# Patient Record
Sex: Female | Born: 1975 | Race: Black or African American | Hispanic: No | Marital: Single | Smoking: Former smoker
Health system: Southern US, Community
[De-identification: ages and names within clinical notes are randomized; demographics above are authoritative.]

## PROBLEM LIST (undated history)

## (undated) ENCOUNTER — Inpatient Hospital Stay (HOSPITAL_COMMUNITY): Payer: Self-pay

## (undated) DIAGNOSIS — I10 Essential (primary) hypertension: Secondary | ICD-10-CM

## (undated) DIAGNOSIS — D649 Anemia, unspecified: Secondary | ICD-10-CM

## (undated) DIAGNOSIS — O021 Missed abortion: Secondary | ICD-10-CM

## (undated) HISTORY — PX: CHOLECYSTECTOMY: SHX55

## (undated) HISTORY — PX: WISDOM TOOTH EXTRACTION: SHX21

---

## 1999-09-14 ENCOUNTER — Emergency Department (HOSPITAL_COMMUNITY): Admission: EM | Admit: 1999-09-14 | Discharge: 1999-09-15 | Payer: Self-pay | Admitting: Emergency Medicine

## 1999-09-15 ENCOUNTER — Encounter: Payer: Self-pay | Admitting: Emergency Medicine

## 2001-12-19 ENCOUNTER — Emergency Department (HOSPITAL_COMMUNITY): Admission: EM | Admit: 2001-12-19 | Discharge: 2001-12-19 | Payer: Self-pay

## 2002-03-12 ENCOUNTER — Other Ambulatory Visit: Admission: RE | Admit: 2002-03-12 | Discharge: 2002-03-12 | Payer: Self-pay | Admitting: *Deleted

## 2002-06-12 ENCOUNTER — Other Ambulatory Visit: Admission: RE | Admit: 2002-06-12 | Discharge: 2002-06-12 | Payer: Self-pay | Admitting: Obstetrics and Gynecology

## 2002-11-04 ENCOUNTER — Other Ambulatory Visit: Admission: RE | Admit: 2002-11-04 | Discharge: 2002-11-04 | Payer: Self-pay | Admitting: *Deleted

## 2002-12-10 ENCOUNTER — Encounter: Payer: Self-pay | Admitting: *Deleted

## 2002-12-10 ENCOUNTER — Encounter: Payer: Self-pay | Admitting: General Surgery

## 2002-12-10 ENCOUNTER — Observation Stay (HOSPITAL_COMMUNITY): Admission: EM | Admit: 2002-12-10 | Discharge: 2002-12-11 | Payer: Self-pay | Admitting: Emergency Medicine

## 2002-12-11 ENCOUNTER — Encounter (INDEPENDENT_AMBULATORY_CARE_PROVIDER_SITE_OTHER): Payer: Self-pay | Admitting: *Deleted

## 2003-03-18 ENCOUNTER — Other Ambulatory Visit: Admission: RE | Admit: 2003-03-18 | Discharge: 2003-03-18 | Payer: Self-pay | Admitting: Obstetrics and Gynecology

## 2003-09-22 ENCOUNTER — Other Ambulatory Visit: Admission: RE | Admit: 2003-09-22 | Discharge: 2003-09-22 | Payer: Self-pay | Admitting: Obstetrics and Gynecology

## 2003-11-22 HISTORY — PX: CHOLECYSTECTOMY: SHX55

## 2004-04-02 ENCOUNTER — Other Ambulatory Visit: Admission: RE | Admit: 2004-04-02 | Discharge: 2004-04-02 | Payer: Self-pay | Admitting: Obstetrics and Gynecology

## 2004-06-07 ENCOUNTER — Other Ambulatory Visit: Admission: RE | Admit: 2004-06-07 | Discharge: 2004-06-07 | Payer: Self-pay | Admitting: Obstetrics and Gynecology

## 2005-04-11 ENCOUNTER — Other Ambulatory Visit: Admission: RE | Admit: 2005-04-11 | Discharge: 2005-04-11 | Payer: Self-pay | Admitting: Obstetrics and Gynecology

## 2005-11-21 HISTORY — PX: TONSILLECTOMY: SUR1361

## 2005-12-20 ENCOUNTER — Other Ambulatory Visit: Admission: RE | Admit: 2005-12-20 | Discharge: 2005-12-20 | Payer: Self-pay | Admitting: Obstetrics & Gynecology

## 2006-05-01 ENCOUNTER — Other Ambulatory Visit: Admission: RE | Admit: 2006-05-01 | Discharge: 2006-05-01 | Payer: Self-pay | Admitting: Obstetrics & Gynecology

## 2007-05-07 ENCOUNTER — Other Ambulatory Visit: Admission: RE | Admit: 2007-05-07 | Discharge: 2007-05-07 | Payer: Self-pay | Admitting: Obstetrics and Gynecology

## 2007-11-23 ENCOUNTER — Other Ambulatory Visit: Admission: RE | Admit: 2007-11-23 | Discharge: 2007-11-23 | Payer: Self-pay | Admitting: Obstetrics and Gynecology

## 2008-05-09 ENCOUNTER — Other Ambulatory Visit: Admission: RE | Admit: 2008-05-09 | Discharge: 2008-05-09 | Payer: Self-pay | Admitting: Obstetrics and Gynecology

## 2008-11-05 ENCOUNTER — Other Ambulatory Visit: Admission: RE | Admit: 2008-11-05 | Discharge: 2008-11-05 | Payer: Self-pay | Admitting: Obstetrics & Gynecology

## 2011-04-08 NOTE — Op Note (Signed)
Mackenzie Moreno, Mackenzie Moreno                          ACCOUNT NO.:  [id number]   MEDICAL RECORD NO.:  [id number]                   PATIENT TYPE:  INP   LOCATION:  5733                                 FACILITY:  [facility]   PHYSICIAN:  Sharlet Salina T. Hoxworth, M.D.          DATE OF BIRTH:  February 03, 1976   DATE OF PROCEDURE:  12/10/2002  DATE OF DISCHARGE:                                 OPERATIVE REPORT   PREOPERATIVE DIAGNOSIS:  Cholecystitis.   POSTOPERATIVE DIAGNOSIS:  Cholecystitis.   OPERATION PERFORMED:  Laparoscopic cholecystectomy with intraoperative  cholangiogram.   SURGEON:  Sharlet Salina T. Hoxworth, M.D.   ANESTHESIA:  General.   ASSISTANT:  Thornton Park. Daphine Deutscher, M.D.   INDICATIONS FOR PROCEDURE:  The patient is a 35 year old black female who  presents with acute onset of epigastric pain radiating to her back  associated with nausea.  She presented to the [facility] Emergency  Department and gallbladder ultrasound was obtained.  This shows very  significant thickening of the gallbladder wall but no definite stones.  She  has some elevation of her transaminases and the rest of her liver function  tests are normal.  Tenderness in the epigastrium was noted on exam and she  has elevated white count.  A laparoscopic cholecystectomy with cholangiogram  for apparent acute cholecystitis has been recommended and accepted.  The  nature of the procedure, its indications and risks of bleeding, infection,  bile leak and bile duct injury were discussed and understand preoperatively.  She is now brought to the operating room for this procedure.   DESCRIPTION OF PROCEDURE:  The patient was brought to the operating room and  placed in supine position on the operating table and general endotracheal  anesthesia was induced.  She had received preoperative antibiotics.  PAS  were in place.  The abdomen was sterilely prepped and draped.  Local  anesthesia was used to infiltrate the trocar sites prior to the  incision.  A  1 cm incision was made at the umbilicus and dissection carried down to the  midline fascia which was sharply incised for 1 cm and the peritoneum entered  under direct vision.  Through a mattress suture of 0 Vicryl, the Hasson  trocar was placed and pneumoperitoneum established.  Under direct vision, a  10 mm trocar was placed in the subxiphoid area and two 5 mm trocars along  the right subcostal margin.  The gallbladder was exposed and appeared quite  edematous but without exudate or gangrene.  The fundus was grasped and  elevated up over the liver and infundibulum retracted inferolaterally.  Fibrofatty tissue was stripped off the neck of the gallbladder toward the  porta hepatis.  The cystic artery was seen coursing up anterior on the  gallbladder wall and was dissected free, doubly clipped proximally, clipped  distally and divided.  The distal gallbladder was thoroughly dissected and  the cystic duct exposed and the cystic duct,  gallbladder junction dissected  360 degrees.  When the anatomy was clear the cystic duct was clipped at the  gallbladder junction and operative cholangiogram was obtained through the  cystic duct.  This showed good filling of normal-sized common bile duct and  intrahepatic ducts with free flow into the duodenum and no filling defects.  Following this, the Cholangiocath was removed and the cystic duct was doubly  clipped proximally and divided.  The gallbladder was then dissected free  from its bed using hook cautery and removed through the umbilicus.  Complete  hemostasis was assured in the operative site and right upper quadrant  irrigated and suctioned until clear.  Trocar was removed under direct  vision.  All CO2 was evacuated from the peritoneal cavity.  The mattress  suture was secured at the umbilicus.  The skin incisions were closed with  interrupted subcuticular 4-0 Monocryl and Steri-Strips.  Sponge, needle and  instrument counts were  correct.  Dry sterile dressings were applied.  The  patient was taken to the recovery room in good condition.                                                Lorne Skeens. Hoxworth, M.D.    Tory Emerald  D:  12/10/2002  T:  12/11/2002  Job:  [id number]

## 2012-05-13 ENCOUNTER — Encounter (HOSPITAL_COMMUNITY): Payer: Self-pay | Admitting: Pharmacist

## 2012-05-14 ENCOUNTER — Encounter (HOSPITAL_COMMUNITY): Payer: Self-pay | Admitting: Pharmacy Technician

## 2012-05-14 ENCOUNTER — Encounter (HOSPITAL_COMMUNITY): Payer: Self-pay | Admitting: *Deleted

## 2012-05-15 ENCOUNTER — Encounter (HOSPITAL_COMMUNITY): Payer: Self-pay

## 2012-05-15 ENCOUNTER — Encounter (HOSPITAL_COMMUNITY)
Admission: RE | Admit: 2012-05-15 | Discharge: 2012-05-15 | Disposition: A | Discharge status: Home / Self Care | Payer: BC Managed Care – PPO | Source: Ambulatory Visit | Attending: Obstetrics & Gynecology | Admitting: Obstetrics & Gynecology

## 2012-05-15 LAB — ABO/RH: ABO/RH(D): O POS

## 2012-05-15 LAB — CBC
HCT: 35.7 % — ABNORMAL LOW (ref 36.0–46.0)
Hemoglobin: 11.9 g/dL — ABNORMAL LOW (ref 12.0–15.0)
MCHC: 33.3 g/dL (ref 30.0–36.0)
RBC: 3.83 MIL/uL — ABNORMAL LOW (ref 3.87–5.11)

## 2012-05-15 NOTE — Patient Instructions (Addendum)
20 Mackenzie Moreno  05/15/2012   Your procedure is scheduled on:  06/17/12  Enter through the Main Entrance of [facility] at 6 AM.  Pick up the phone at the desk and dial [phone].   Call this number if you have problems the morning of surgery: [phone]   Remember:   Do not eat food:After Midnight.  Do not drink clear liquids: After Midnight.  Take these medicines the morning of surgery with A SIP OF WATER: Blood pressure medication   Do not wear jewelry, make-up or nail polish.  Do not wear lotions, powders, or perfumes. You may wear deodorant.  Do not shave 48 hours prior to surgery.  Do not bring valuables to the hospital.  Contacts, dentures or bridgework may not be worn into surgery.  Leave suitcase in the car. After surgery it may be brought to your room.  For patients admitted to the hospital, checkout time is 11:00 AM the day of discharge.   Patients discharged the day of surgery will not be allowed to drive home.  Name and phone number of your driver: Kandra Nicolas   [phone]  Special Instructions: CHG Shower Use Special Wash: 1/2 bottle night before surgery and 1/2 bottle morning of surgery.   Please read over the following fact sheets that you were given: Surgical Site Infection Prevention

## 2012-05-17 MED ORDER — CEFAZOLIN SODIUM-DEXTROSE 2-3 GM-% IV SOLR
2.0000 g | INTRAVENOUS | Status: AC
  Administered 2012-05-18: 2 g via INTRAVENOUS
  Filled 2012-05-17: qty 50

## 2012-05-18 ENCOUNTER — Ambulatory Visit (HOSPITAL_COMMUNITY)
Admission: RE | Admit: 2012-05-18 | Discharge: 2012-05-18 | Disposition: A | Discharge status: Home / Self Care | Payer: BC Managed Care – PPO | Source: Ambulatory Visit | Attending: Obstetrics & Gynecology | Admitting: Obstetrics & Gynecology

## 2012-05-18 ENCOUNTER — Encounter (HOSPITAL_COMMUNITY)
Admission: RE | Disposition: A | Discharge status: Home / Self Care | Payer: Self-pay | Source: Ambulatory Visit | Attending: Obstetrics & Gynecology

## 2012-05-18 ENCOUNTER — Encounter (HOSPITAL_COMMUNITY): Payer: Self-pay | Admitting: Anesthesiology

## 2012-05-18 ENCOUNTER — Ambulatory Visit (HOSPITAL_COMMUNITY): Payer: BC Managed Care – PPO | Admitting: Anesthesiology

## 2012-05-18 DIAGNOSIS — Z01812 Encounter for preprocedural laboratory examination: Secondary | ICD-10-CM | POA: Insufficient documentation

## 2012-05-18 DIAGNOSIS — O021 Missed abortion: Secondary | ICD-10-CM | POA: Insufficient documentation

## 2012-05-18 HISTORY — PX: DILATION AND EVACUATION: SHX1459

## 2012-05-18 HISTORY — DX: Anemia, unspecified: D64.9

## 2012-05-18 HISTORY — DX: Essential (primary) hypertension: I10

## 2012-05-18 HISTORY — DX: Missed abortion: O02.1

## 2012-05-18 SURGERY — DILATION AND EVACUATION, UTERUS
Anesthesia: Monitor Anesthesia Care | Site: Uterus | Wound class: Clean Contaminated

## 2012-05-18 MED ORDER — ONDANSETRON HCL 4 MG/2ML IJ SOLN
INTRAMUSCULAR | Status: AC
  Filled 2012-05-18: qty 2

## 2012-05-18 MED ORDER — FENTANYL CITRATE 0.05 MG/ML IJ SOLN
INTRAMUSCULAR | Status: AC
  Administered 2012-05-18: 50 ug via INTRAVENOUS
  Filled 2012-05-18: qty 2

## 2012-05-18 MED ORDER — PROPOFOL 10 MG/ML IV EMUL
INTRAVENOUS | Status: DC | PRN
  Administered 2012-05-18: 180 mg via INTRAVENOUS

## 2012-05-18 MED ORDER — MIDAZOLAM HCL 2 MG/2ML IJ SOLN
INTRAMUSCULAR | Status: AC
  Filled 2012-05-18: qty 2

## 2012-05-18 MED ORDER — LIDOCAINE HCL (CARDIAC) 20 MG/ML IV SOLN
INTRAVENOUS | Status: AC
  Filled 2012-05-18: qty 5

## 2012-05-18 MED ORDER — ONDANSETRON HCL 4 MG/2ML IJ SOLN: 4.0000 mg | Freq: Once | INTRAMUSCULAR | Status: DC | PRN

## 2012-05-18 MED ORDER — OXYCODONE-ACETAMINOPHEN 5-325 MG PO TABS: 1.0000 | ORAL_TABLET | ORAL | Status: AC | PRN

## 2012-05-18 MED ORDER — LIDOCAINE-EPINEPHRINE 1 %-1:100000 IJ SOLN
INTRAMUSCULAR | Status: DC | PRN
  Administered 2012-05-18: 10 mL

## 2012-05-18 MED ORDER — DEXAMETHASONE SODIUM PHOSPHATE 10 MG/ML IJ SOLN
INTRAMUSCULAR | Status: AC
  Filled 2012-05-18: qty 1

## 2012-05-18 MED ORDER — MIDAZOLAM HCL 5 MG/5ML IJ SOLN
INTRAMUSCULAR | Status: DC | PRN
  Administered 2012-05-18: 2 mg via INTRAVENOUS

## 2012-05-18 MED ORDER — KETOROLAC TROMETHAMINE 30 MG/ML IJ SOLN
INTRAMUSCULAR | Status: DC | PRN
  Administered 2012-05-18: 30 mg via INTRAVENOUS

## 2012-05-18 MED ORDER — FENTANYL CITRATE 0.05 MG/ML IJ SOLN
25.0000 ug | INTRAMUSCULAR | Status: DC | PRN
  Administered 2012-05-18: 50 ug via INTRAVENOUS

## 2012-05-18 MED ORDER — KETOROLAC TROMETHAMINE 60 MG/2ML IM SOLN
INTRAMUSCULAR | Status: DC | PRN
  Administered 2012-05-18: 30 mg via INTRAMUSCULAR

## 2012-05-18 MED ORDER — LIDOCAINE HCL (CARDIAC) 20 MG/ML IV SOLN
INTRAVENOUS | Status: DC | PRN
  Administered 2012-05-18: 60 mg via INTRAVENOUS

## 2012-05-18 MED ORDER — PROPOFOL 10 MG/ML IV EMUL
INTRAVENOUS | Status: AC
  Filled 2012-05-18: qty 20

## 2012-05-18 MED ORDER — KETOROLAC TROMETHAMINE 30 MG/ML IJ SOLN: 15.0000 mg | Freq: Once | INTRAMUSCULAR | Status: DC | PRN

## 2012-05-18 MED ORDER — ONDANSETRON HCL 4 MG/2ML IJ SOLN
INTRAMUSCULAR | Status: DC | PRN
  Administered 2012-05-18: 4 mg via INTRAVENOUS

## 2012-05-18 MED ORDER — FENTANYL CITRATE 0.05 MG/ML IJ SOLN
INTRAMUSCULAR | Status: AC
  Filled 2012-05-18: qty 2

## 2012-05-18 MED ORDER — FENTANYL CITRATE 0.05 MG/ML IJ SOLN
INTRAMUSCULAR | Status: DC | PRN
  Administered 2012-05-18: 100 ug via INTRAVENOUS

## 2012-05-18 MED ORDER — LACTATED RINGERS IV SOLN
INTRAVENOUS | Status: DC
  Administered 2012-05-18 (×2): via INTRAVENOUS

## 2012-05-18 MED ORDER — MEPERIDINE HCL 25 MG/ML IJ SOLN: 6.2500 mg | INTRAMUSCULAR | Status: DC | PRN

## 2012-05-18 MED ORDER — DEXAMETHASONE SODIUM PHOSPHATE 10 MG/ML IJ SOLN
INTRAMUSCULAR | Status: DC | PRN
  Administered 2012-05-18: 10 mg via INTRAVENOUS

## 2012-05-18 SURGICAL SUPPLY — 16 items
CATH ROBINSON RED A/P 16FR (CATHETERS) ×2 IMPLANT
CLOTH BEACON ORANGE TIMEOUT ST (SAFETY) ×2 IMPLANT
DECANTER SPIKE VIAL GLASS SM (MISCELLANEOUS) ×2 IMPLANT
GLOVE BIOGEL PI IND STRL 7.0 (GLOVE) ×1 IMPLANT
GLOVE BIOGEL PI INDICATOR 7.0 (GLOVE) ×1
GLOVE ECLIPSE 6.5 STRL STRAW (GLOVE) ×4 IMPLANT
GOWN STRL REIN XL XLG (GOWN DISPOSABLE) ×4 IMPLANT
KIT BERKELEY 1ST TRIMESTER 3/8 (MISCELLANEOUS) ×2 IMPLANT
NEEDLE SPNL 22GX3.5 QUINCKE BK (NEEDLE) ×2 IMPLANT
NS IRRIG 1000ML POUR BTL (IV SOLUTION) ×2 IMPLANT
PACK VAGINAL MINOR WOMEN LF (CUSTOM PROCEDURE TRAY) ×2 IMPLANT
PAD PREP 24X48 CUFFED NSTRL (MISCELLANEOUS) ×2 IMPLANT
SET BERKELEY SUCTION TUBING (SUCTIONS) ×2 IMPLANT
SYR CONTROL 10ML LL (SYRINGE) ×2 IMPLANT
TOWEL OR 17X24 6PK STRL BLUE (TOWEL DISPOSABLE) ×4 IMPLANT
VACURETTE 8 RIGID CVD (CANNULA) ×2 IMPLANT

## 2012-05-18 NOTE — Transfer of Care (Signed)
Immediate Anesthesia Transfer of Care Note  Patient: Mackenzie Moreno  Procedure(s) Performed: Procedure(s) (LRB): DILATATION AND EVACUATION (N/A)  Patient Location: PACU  Anesthesia Type: General  Level of Consciousness: awake  Airway & Oxygen Therapy: Patient Spontanous Breathing and Patient connected to nasal cannula oxygen  Post-op Assessment: Report given to PACU RN and Post -op Vital signs reviewed and stable  Post vital signs: stable  Complications: No apparent anesthesia complications

## 2012-05-18 NOTE — Op Note (Signed)
05/18/2012  8:14 AM  PATIENT:  Mackenzie Moreno  36 y.o. female with unsure LMP but approximately 12-[redacted] weeks gestation with missed abortion  PRE-OPERATIVE DIAGNOSIS:  Missed Abortion  POST-OPERATIVE DIAGNOSIS:  Missed Abortion  PROCEDURE:  Procedure(s): DILATATION AND EVACUATION  SURGEON:  Whittany Parish SUZANNE  ASSISTANTS: OR staff   ANESTHESIA:   general  ESTIMATED BLOOD LOSS: * No blood loss amount entered *  BLOOD ADMINISTERED:none   FLUIDS: 1000 cc LR  UOP: 250 cc clear, drained with I&O cath at beginning of procedure  SPECIMEN:  Products of conception  DISPOSITION OF SPECIMEN:  PATHOLOGY  FINDINGS: uterus about 10 weeks in size, anteflexed, mobile  DESCRIPTION OF OPERATION: Patient was taken to the operating room. She placed in the supine position. Anesthesia was administered by the anesthesia staff without difficult. Legs were then lifted to the low lithotomy position in [place] stirrups. SCDs were on her lower extremities bilaterally and functioning properly. The legs were then lifted to the high lithotomy position. Timeout was performed. 2 g of Ancef were given. Patient was prepped with Betadine prep on the perineum, inner thighs, and vagina x3. She was then draped in a normal standard fashion.  A standard I&O catheterization of the bladder was performed with a red rubber foley catheter.  Then a bivalve speculum was placed in the vagina. The anterior lip of the cervix was grasped with a single-tooth tenaculum. A paracervical block 1% lidocaine mixed 1:1 with epinephrine (1:100,000 units) was used. 10 cc total was used for the paracervical block. The uterus sounded to 10 cm with the uterine sound. The uterus was dilated to #25 with Shawnie Pons dilators. Then #8 curved suction tip was obtained. This was passed through the endocervical canal to the fundus of the uterus. Suction was applied. Again a clockwise fashion the contents of the uterus were evacuated. Suction was stopped and the  tip was removed. Then using a #1 smooth curette, the endometrial cavity was curetted until rough gritty texture was noted in all quadrants. 2 more passes of the suction tip were performed, though minimal blood or tissue was obtained. At this point the procedure was ended. The suction was discontinued and the tip was removed from the uterus. There was minimal bleeding from the cervical os. The tenaculum was removed from the anterior lip of the cervix. There is no bleeding noted from the tenaculum sites. The speculum was then removed.  Sponge, laps, instruments, and needle counts were correct x2. The legs are positioned back down to the supine position after the Betadine prep was cleansed of the skin. Patient tolerated procedure well. She was awake from anesthesia and taken to recovery stable condition. An in and  COUNTS:  YES  PLAN OF CARE: Transfer to PACU

## 2012-05-18 NOTE — Anesthesia Preprocedure Evaluation (Addendum)
Anesthesia Evaluation  Patient identified by MRN, date of birth, ID band Patient awake    Reviewed: Allergy & Precautions, H&P , Patient's Chart, lab work & pertinent test results, reviewed documented beta blocker date and time   Airway Mallampati: II TM Distance: >3 FB Neck ROM: full    Dental No notable dental hx.    Pulmonary  breath sounds clear to auscultation  Pulmonary exam normal       Cardiovascular hypertension, On Medications Rhythm:regular Rate:Normal     Neuro/Psych    GI/Hepatic   Endo/Other    Renal/GU      Musculoskeletal   Abdominal   Peds  Hematology   Anesthesia Other Findings   Reproductive/Obstetrics                          Anesthesia Physical Anesthesia Plan  ASA: II  Anesthesia Plan: General and MAC   Post-op Pain Management:    Induction: Intravenous  Airway Management Planned: LMA, Mask and Simple Face Mask  Additional Equipment:   Intra-op Plan:   Post-operative Plan:   Informed Consent: I have reviewed the patients History and Physical, chart, labs and discussed the procedure including the risks, benefits and alternatives for the proposed anesthesia with the patient or authorized representative who has indicated his/her understanding and acceptance.   Dental Advisory Given  Plan Discussed with: CRNA and Surgeon  Anesthesia Plan Comments: (  Discussed MAC ; also possibly LMA( general anesthesia), including possible nausea, instrumentation of airway, sore throat,pulmonary aspiration, etc. I asked if the were any outstanding questions, or  concerns before we proceeded. )       Anesthesia Quick Evaluation

## 2012-05-18 NOTE — Anesthesia Postprocedure Evaluation (Signed)
Anesthesia Post Note  Patient: Mackenzie Moreno  Procedure(s) Performed: Procedure(s) (LRB): DILATATION AND EVACUATION (N/A)  Anesthesia type: General  Patient location: PACU  Post pain: Pain level controlled  Post assessment: Post-op Vital signs reviewed  Last Vitals:  Filed Vitals:   05/18/12 0830  BP: 134/89  Pulse: 91  Temp:   Resp: 23    Post vital signs: Reviewed  Level of consciousness: sedated  Complications: No apparent anesthesia complicationsfj

## 2012-05-18 NOTE — H&P (Signed)
Mackenzie Moreno is an 36 y.o. Mackenzie Moreno with missed abortion here for definitive surgical management.  Patient's LMP is not definite but best estimate is 02/20/12.  Because of unsure LMP, she has been followed with serial ultrasounds/HCGs in my office.  First ultrasound was performed 5/22 showing a gestational sac, yolk sac but no evidence of fetal pole.  Follow up ultrasound on 6/4 showed fetal pole but inappropriate growth of the gestational sac.  Questionable heartbeat at 70 bpm but this was very similar to her heart rate that visit.  Ultrasound again repeated on 6/19 with no interval growth of fetal pole and now clearly no heartbeat.  At this time, patient requested definitive management.  She requested to wait until today because of scheduling2 with work.  Pertinent Gynecological History: Menses: none since 4/1 Bleeding: none Contraception: none DES exposure: denies Blood transfusions: none Sexually transmitted diseases: none [facility]  Last mammogram: n/a Date: n/a Last pap: normal Date: 7/12 OB History: G2, P0   Menstrual History: Menarche age: 80-14 Patient's last menstrual period was 02/20/2012.    Past Medical History  Diagnosis Date  . Hypertension     on aldomet  . Anemia   . Missed ab current    Past Surgical History  Procedure Date  . Wisdom tooth extraction   . Cholecystectomy 2005  . Tonsillectomy 2007    History reviewed. No pertinent family history.  Social History:  reports that she has never smoked. She does not have any smokeless tobacco history on file. She reports that she drinks alcohol. She reports that she does not use illicit drugs.  Allergies:  Allergies  Allergen Reactions  . Vicodin (Hydrocodone-Acetaminophen) Rash    Prescriptions prior to admission  Medication Sig Dispense Refill  . methyldopa (ALDOMET) 250 MG tablet Take 250 mg by mouth 2 (two) times daily.      . Prenatal Vit-Fe  Fumarate-FA (PRENATAL MULTIVITAMIN) TABS Take 1 tablet by mouth every morning.        Review of Systems  Constitutional: Negative for fever and chills.  Respiratory: Negative for cough and hemoptysis.   Cardiovascular: Negative for chest pain and palpitations.  Gastrointestinal: Negative for heartburn and nausea.  Genitourinary: Negative for dysuria.  Musculoskeletal: Negative for myalgias.  Skin: Negative for rash.  Neurological: Negative for headaches.  Endo/Heme/Allergies: Does not bruise/bleed easily.  Psychiatric/Behavioral: Negative for depression.    Blood pressure 125/84, pulse 80, temperature 98.8 F (37.1 C), temperature source Oral, resp. rate 16, last menstrual period 02/20/2012, SpO2 99.00%. Physical Exam  Vitals reviewed. Constitutional: She is oriented to person, place, and time. She appears well-developed and well-nourished.  HENT:  Head: Normocephalic and atraumatic.  Neck: Normal range of motion. Neck supple.  Cardiovascular: Normal rate, regular rhythm and normal heart sounds.   Respiratory: Effort normal and breath sounds normal.  GI: Soft. Bowel sounds are normal.  Musculoskeletal: Normal range of motion.  Neurological: She is alert and oriented to person, place, and time.  Skin: Skin is warm and dry.  Psychiatric: She has a normal mood and affect.    No results found for this or any previous visit (from the past 24 hour(s)).  No results found.  Assessment/Plan: 36 year old G7A1 Moreno with missed abortion here for definitive management via suction D&E.  Risks and benefits have all been discussed in my office.  Patient here and ready to proceed.  Valentina Shaggy SUZANNE 05/18/2012, 7:07 AM

## 2012-05-18 NOTE — Discharge Instructions (Addendum)
DISCHARGE INSTRUCTIONS: D&E  **No Ibuprofen containing medications until after 2:00 pm today**  The following instructions have been prepared to help you care for yourself upon your return home.   Personal hygiene: Marland Kitchen Use sanitary pads for vaginal drainage, not tampons. . Shower the day after your procedure. . NO tub baths, pools or Jacuzzis for 2-3 weeks. . Wipe front to back after using the bathroom.  Activity and limitations: . Do NOT drive or operate any equipment for 24 hours. The effects of anesthesia are still present and drowsiness may result. . Do NOT rest in bed all day. . Walking is encouraged. . Walk up and down stairs slowly. . You may resume your normal activity in one to two days or as indicated by your physician.  Sexual activity: NO intercourse for at least 2 weeks after the procedure, or as indicated by your physician.  Diet: Eat a light meal as desired this evening. You may resume your usual diet tomorrow.  Return to work: You may resume your work activities in one to two days or as indicated by your doctor.  What to expect after your surgery: Expect to have vaginal bleeding/discharge for 2-3 days and spotting for up to 10 days. It is not unusual to have soreness for up to 1-2 weeks. You may have a slight burning sensation when you urinate for the first day. Mild cramps may continue for a couple of days. You may have a regular period in 2-6 weeks.  You should not have heavy bleeding.  If you do, you need to call the office and reach the on-call physician at night or over the weekend.  Call your doctor for any of the following: . Excessive vaginal bleeding, saturating and changing one pad every hour. . Inability to urinate 6 hours after discharge from hospital. . Pain not relieved by pain medication. . Fever of 100.4 F or greater. . Unusual vaginal discharge or odor.  Medication: Use Percocet as needed for significant cramping.  Otherwise, you can take 800mg   Motrin/ibuprofen (4, 200mg  tablets) every 8 hours as needed for cramping. Once your Aldomet is gone, you can restart your Lisinopril.  Please call the office if you need a new prescription for this.  Return to office ________________ Call for an appointment ___________________  Patient's signature: ______________________  Nurse's signature ________________________  Support person's signature________________________

## 2012-05-22 ENCOUNTER — Encounter (HOSPITAL_COMMUNITY): Payer: Self-pay | Admitting: Obstetrics & Gynecology

## 2013-09-23 ENCOUNTER — Emergency Department (HOSPITAL_COMMUNITY)
Admission: EM | Admit: 2013-09-23 | Discharge: 2013-09-23 | Disposition: A | Discharge status: Home / Self Care | Payer: No Typology Code available for payment source | Attending: Emergency Medicine | Admitting: Emergency Medicine

## 2013-09-23 ENCOUNTER — Encounter (HOSPITAL_COMMUNITY): Payer: Self-pay | Admitting: Emergency Medicine

## 2013-09-23 DIAGNOSIS — I10 Essential (primary) hypertension: Secondary | ICD-10-CM | POA: Insufficient documentation

## 2013-09-23 DIAGNOSIS — Z79899 Other long term (current) drug therapy: Secondary | ICD-10-CM | POA: Insufficient documentation

## 2013-09-23 DIAGNOSIS — Z8742 Personal history of other diseases of the female genital tract: Secondary | ICD-10-CM | POA: Insufficient documentation

## 2013-09-23 DIAGNOSIS — Z862 Personal history of diseases of the blood and blood-forming organs and certain disorders involving the immune mechanism: Secondary | ICD-10-CM | POA: Insufficient documentation

## 2013-09-23 DIAGNOSIS — R21 Rash and other nonspecific skin eruption: Secondary | ICD-10-CM | POA: Insufficient documentation

## 2013-09-23 MED ORDER — PREDNISONE 20 MG PO TABS
60.0000 mg | ORAL_TABLET | Freq: Once | ORAL | Status: AC
  Administered 2013-09-23: 60 mg via ORAL
  Filled 2013-09-23: qty 3

## 2013-09-23 MED ORDER — FAMOTIDINE 20 MG PO TABS
20.0000 mg | ORAL_TABLET | Freq: Once | ORAL | Status: AC
  Administered 2013-09-23: 20 mg via ORAL
  Filled 2013-09-23: qty 1

## 2013-09-23 MED ORDER — FAMOTIDINE 20 MG PO TABS: 20.0000 mg | ORAL_TABLET | Freq: Two times a day (BID) | ORAL | Status: DC

## 2013-09-23 MED ORDER — PREDNISONE 10 MG PO TABS: 20.0000 mg | ORAL_TABLET | Freq: Every day | ORAL | Status: DC

## 2013-09-23 NOTE — ED Provider Notes (Addendum)
CSN: [id number]     Arrival date & time 09/23/13  1745 History   First MD Initiated Contact with Patient 09/23/13 1928     Chief Complaint  Patient presents with  . Rash   (Consider location/radiation/quality/duration/timing/severity/associated sxs/prior Treatment) HPI Comments: Patient reports, that 2 weeks, ago, she began with an itchy rash on her chest that has spread to her trunk, lower back, and today.  She noticed some lesions on her forearms.  She has tried Clorox therapy 3, days ago, to her anterior chest, which just excoriated.  The skin did nothing for the rash or itch.  She has not taken any of the medication.  She denies any shortness of breath, tongue swelling, dizziness, abdominal pain.  New cosmetics or clothing, etc. Or Hx of having previous rash  Patient is a 37 y.o. female presenting with rash. The history is provided by the patient.  Rash Location:  Torso Torso rash location:  R chest, L chest, abd LUQ, abd RUQ, abd LLQ, abd RLQ and lower back Quality: burning, dryness, itchiness, painful and redness   Quality: not blistering, not bruising, not draining, not scaling, not swelling and not weeping   Pain details:    Quality:  Burning and itching   Severity:  No pain   Onset quality:  Gradual   Duration:  2 months   Timing:  Constant   Progression:  Worsening Onset quality:  Gradual Duration:  2 weeks Timing:  Constant Progression:  Worsening Chronicity:  New Context: not animal contact, not exposure to similar rash, not insect bite/sting, not medications, not new detergent/soap and not sick contacts   Relieved by:  Anti-itch cream Worsened by:  Nothing tried Ineffective treatments: clorox therapy. Associated symptoms: no abdominal pain, no fever, no headaches, no joint pain, no myalgias and no shortness of breath     Past Medical History  Diagnosis Date  . Hypertension     on aldomet  . Anemia   . Missed ab current   Past Surgical History  Procedure  Laterality Date  . Wisdom tooth extraction    . Cholecystectomy  2005  . Tonsillectomy  2007  . Dilation and evacuation  05/18/2012    Procedure: DILATATION AND EVACUATION;  Surgeon: Annamaria Boots, MD;  Location: [facility];  Service: Gynecology;  Laterality: N/A;   History reviewed. No pertinent family history. History  Substance Use Topics  . Smoking status: Never Smoker   . Smokeless tobacco: Not on file  . Alcohol Use: Yes     Comment: social   OB History   Grav Para Term Preterm Abortions TAB SAB Ect Mult Living                 Review of Systems  Constitutional: Negative for fever and chills.  HENT: Negative for trouble swallowing.   Respiratory: Negative for cough and shortness of breath.   Cardiovascular: Negative for chest pain.  Gastrointestinal: Negative for abdominal pain.  Musculoskeletal: Negative for arthralgias and myalgias.  Skin: Positive for rash. Negative for wound.  Neurological: Negative for headaches.  All other systems reviewed and are negative.    Allergies  Vicodin  Home Medications   Current Outpatient Rx  Name  Route  Sig  Dispense  Refill  . famotidine (PEPCID) 20 MG tablet   Oral   Take 1 tablet (20 mg total) by mouth 2 (two) times daily.   10 tablet   0   . methyldopa (ALDOMET) 250 MG tablet  Oral   Take 250 mg by mouth 2 (two) times daily.         . predniSONE (DELTASONE) 10 MG tablet   Oral   Take 2 tablets (20 mg total) by mouth daily.   14 tablet   0    BP 150/108  Pulse 94  Temp(Src) 99.2 F (37.3 C) (Oral)  Resp 16  Wt 234 lb 8 oz (106.369 kg)  SpO2 99% Physical Exam  Nursing note and vitals reviewed. Constitutional: She appears well-developed and well-nourished.  HENT:  Head: Normocephalic.  Eyes: Pupils are equal, round, and reactive to light.  Neck: Normal range of motion.  Cardiovascular: Normal rate.   Pulmonary/Chest: Effort normal.  Musculoskeletal: Normal range of motion.  Neurological: She is  alert.  Skin: Rash noted.    ED Course  Procedures (including critical care time) Labs Review Labs Reviewed - No data to display Imaging Review No results found.  EKG Interpretation   None       MDM   1. Rash and nonspecific skin eruption     Patient has a red, slightly raised rash with excoriations to the anterior chest, where she is apply Clorox and scratch the area.  There is new sign of superimposed infection.  We'll treat with Pepcid and steroids    Arman Filter, NP 09/23/13 1943  Arman Filter, NP 10/02/13 [phone]

## 2013-09-23 NOTE — ED Notes (Signed)
Pt with red rash to chest last week, started to spread to abdomen and back this week. Initially was not itchy or painful but she did try otc creams and also applied small amount of clorox to the rash which "only irritated it and made it hurt.'

## 2013-09-23 NOTE — Discharge Instructions (Signed)
Rashes are generally nonspecific as his yours You have been treated with an antihistamine as well as a steroid to break the histamine cycle.  Please take medications as directed.  Return if your rash is worsening.  We develop new concerning symptoms

## 2013-09-23 NOTE — ED Provider Notes (Signed)
Medical screening examination/treatment/procedure(s) were performed by non-physician practitioner and as supervising physician I was immediately available for consultation/collaboration.  Ardyn Forge L Saida Lonon, MD 09/23/13 2209 

## 2013-10-07 NOTE — ED Provider Notes (Signed)
Medical screening examination/treatment/procedure(s) were performed by non-physician practitioner and as supervising physician I was immediately available for consultation/collaboration.  EKG Interpretation   None        Flint Melter, MD 10/07/13 1112

## 2013-11-04 ENCOUNTER — Ambulatory Visit: Payer: Self-pay | Admitting: Nurse Practitioner

## 2014-01-18 ENCOUNTER — Inpatient Hospital Stay (HOSPITAL_COMMUNITY)
Admission: AD | Admit: 2014-01-18 | Discharge: 2014-01-18 | Disposition: A | Discharge status: Home / Self Care | Payer: BC Managed Care – PPO | Source: Ambulatory Visit | Attending: Obstetrics and Gynecology | Admitting: Obstetrics and Gynecology

## 2014-01-18 ENCOUNTER — Encounter (HOSPITAL_COMMUNITY): Payer: Self-pay | Admitting: *Deleted

## 2014-01-18 ENCOUNTER — Inpatient Hospital Stay (HOSPITAL_COMMUNITY): Payer: No Typology Code available for payment source

## 2014-01-18 DIAGNOSIS — O209 Hemorrhage in early pregnancy, unspecified: Secondary | ICD-10-CM

## 2014-01-18 DIAGNOSIS — O10019 Pre-existing essential hypertension complicating pregnancy, unspecified trimester: Secondary | ICD-10-CM | POA: Insufficient documentation

## 2014-01-18 DIAGNOSIS — O26859 Spotting complicating pregnancy, unspecified trimester: Secondary | ICD-10-CM | POA: Insufficient documentation

## 2014-01-18 DIAGNOSIS — A599 Trichomoniasis, unspecified: Secondary | ICD-10-CM

## 2014-01-18 DIAGNOSIS — I10 Essential (primary) hypertension: Secondary | ICD-10-CM

## 2014-01-18 DIAGNOSIS — M549 Dorsalgia, unspecified: Secondary | ICD-10-CM | POA: Insufficient documentation

## 2014-01-18 LAB — CBC
HCT: 37.9 % (ref 36.0–46.0)
HEMOGLOBIN: 12.7 g/dL (ref 12.0–15.0)
MCH: 31.1 pg (ref 26.0–34.0)
MCHC: 33.5 g/dL (ref 30.0–36.0)
MCV: 92.9 fL (ref 78.0–100.0)
PLATELETS: 320 10*3/uL (ref 150–400)
RBC: 4.08 MIL/uL (ref 3.87–5.11)
RDW: 13.8 % (ref 11.5–15.5)
WBC: 9.3 10*3/uL (ref 4.0–10.5)

## 2014-01-18 LAB — URINALYSIS, ROUTINE W REFLEX MICROSCOPIC
BILIRUBIN URINE: NEGATIVE
Glucose, UA: NEGATIVE mg/dL
Ketones, ur: NEGATIVE mg/dL
Leukocytes, UA: NEGATIVE
NITRITE: NEGATIVE
PH: 5.5 (ref 5.0–8.0)
Protein, ur: NEGATIVE mg/dL
Urobilinogen, UA: 0.2 mg/dL (ref 0.0–1.0)

## 2014-01-18 LAB — WET PREP, GENITAL: YEAST WET PREP: NONE SEEN

## 2014-01-18 LAB — URINE MICROSCOPIC-ADD ON: RBC / HPF: NONE SEEN RBC/hpf (ref <3)

## 2014-01-18 LAB — POCT PREGNANCY, URINE: PREG TEST UR: POSITIVE — AB

## 2014-01-18 LAB — HCG, QUANTITATIVE, PREGNANCY: HCG, BETA CHAIN, QUANT, S: 3069 m[IU]/mL — AB (ref <5)

## 2014-01-18 MED ORDER — METRONIDAZOLE 500 MG PO TABS: 500.0000 mg | ORAL_TABLET | Freq: Four times a day (QID) | ORAL | Status: DC

## 2014-01-18 MED ORDER — PROMETHAZINE HCL 25 MG PO TABS: 25.0000 mg | ORAL_TABLET | Freq: Four times a day (QID) | ORAL | Status: DC | PRN

## 2014-01-18 NOTE — MAU Provider Note (Signed)
History     CSN: [id number]  Arrival date and time: 01/18/14 1245   First Provider Initiated Contact with Patient 01/18/14 1353      Chief Complaint  Patient presents with  . Spotting    HPI Comments: Mackenzie Moreno 38 y.o. [id number] presents to MAU with spotting in early pregnancy ( [redacted]w[redacted]d)  x 3 days. Some cramping and back pains as well. She has had one SAB and one TAB. She is known hypertensive who has not taken her Aldomet for over one year. BP elevated today at 155/88. Recheck 139/86.  Blood type known to be O positive.     Past Medical History  Diagnosis Date  . Hypertension     on aldomet  . Anemia   . Missed ab current    Past Surgical History  Procedure Laterality Date  . Wisdom tooth extraction    . Cholecystectomy  2005  . Tonsillectomy  2007  . Dilation and evacuation  05/18/2012    Procedure: DILATATION AND EVACUATION;  Surgeon: Annamaria Boots, MD;  Location: [facility];  Service: Gynecology;  Laterality: N/A;    History reviewed. No pertinent family history.  History  Substance Use Topics  . Smoking status: Never Smoker   . Smokeless tobacco: Not on file  . Alcohol Use: Yes     Comment: social    Allergies:  Allergies  Allergen Reactions  . Vicodin [Hydrocodone-Acetaminophen] Rash    Prescriptions prior to admission  Medication Sig Dispense Refill  . Prenatal Vit-Fe Fumarate-FA (PRENATAL MULTIVITAMIN) TABS tablet Take 1 tablet by mouth daily at 12 noon.        Review of Systems  Constitutional: Negative.   HENT: Negative.   Eyes: Negative.   Respiratory: Negative.   Cardiovascular: Negative.   Gastrointestinal: Positive for abdominal pain.  Genitourinary:       Vaginal spotting  Musculoskeletal: Positive for back pain.  Skin: Negative.   Neurological: Negative.   Psychiatric/Behavioral: Negative.    Physical Exam   Blood pressure 155/88, pulse 87, temperature 99.3 F (37.4 C), temperature source Oral, resp. rate 18, height 5' 8.5"  (1.74 m), weight 110.904 kg (244 lb 8 oz), last menstrual period 11/21/2013.  Physical Exam  Constitutional: She is oriented to person, place, and time. She appears well-developed and well-nourished. No distress.  HENT:  Head: Normocephalic and atraumatic.  Eyes: Pupils are equal, round, and reactive to light.  Neck: Normal range of motion.  GI: Soft. She exhibits no distension. There is no tenderness. There is no rebound.  Genitourinary:  Genital:External negative Vaginal:small amount pink discharge Cervix:Closed/ long Bimanual:Nontender   Neurological: She is alert and oriented to person, place, and time.  Skin: Skin is warm and dry.  Psychiatric: She has a normal mood and affect. Her behavior is normal. Thought content normal.   Results for orders placed during the hospital encounter of 01/18/14 (from the past 24 hour(s))  URINALYSIS, ROUTINE W REFLEX MICROSCOPIC     Status: Abnormal   Collection Time    01/18/14  1:13 PM      Result Value Ref Range   Color, Urine YELLOW  YELLOW   APPearance CLEAR  CLEAR   Specific Gravity, Urine <1.005 (*) 1.005 - 1.030   pH 5.5  5.0 - 8.0   Glucose, UA NEGATIVE  NEGATIVE mg/dL   Hgb urine dipstick SMALL (*) NEGATIVE   Bilirubin Urine NEGATIVE  NEGATIVE   Ketones, ur NEGATIVE  NEGATIVE mg/dL   Protein, ur  NEGATIVE  NEGATIVE mg/dL   Urobilinogen, UA 0.2  0.0 - 1.0 mg/dL   Nitrite NEGATIVE  NEGATIVE   Leukocytes, UA NEGATIVE  NEGATIVE  URINE MICROSCOPIC-ADD ON     Status: None   Collection Time    01/18/14  1:13 PM      Result Value Ref Range   Squamous Epithelial / LPF RARE  RARE   WBC, UA 0-2  <3 WBC/hpf   RBC / HPF    <3 RBC/hpf   Value: NO FORMED ELEMENTS SEEN ON URINE MICROSCOPIC EXAMINATION   Urine-Other TRICHOMONAS PRESENT    POCT PREGNANCY, URINE     Status: Abnormal   Collection Time    01/18/14  1:17 PM      Result Value Ref Range   Preg Test, Ur POSITIVE (*) NEGATIVE  WET PREP, GENITAL     Status: Abnormal   Collection  Time    01/18/14  2:10 PM      Result Value Ref Range   Yeast Wet Prep HPF POC NONE SEEN  NONE SEEN   Trich, Wet Prep FEW (*) NONE SEEN   Clue Cells Wet Prep HPF POC FEW (*) NONE SEEN   WBC, Wet Prep HPF POC FEW (*) NONE SEEN  CBC     Status: None   Collection Time    01/18/14  2:26 PM      Result Value Ref Range   WBC 9.3  4.0 - 10.5 K/uL   RBC 4.08  3.87 - 5.11 MIL/uL   Hemoglobin 12.7  12.0 - 15.0 g/dL   HCT 16.137.9  09.636.0 - 04.546.0 %   MCV 92.9  78.0 - 100.0 fL   MCH 31.1  26.0 - 34.0 pg   MCHC 33.5  30.0 - 36.0 g/dL   RDW 40.913.8  81.111.5 - 91.415.5 %   Platelets 320  150 - 400 K/uL  HCG, QUANTITATIVE, PREGNANCY     Status: Abnormal   Collection Time    01/18/14  2:26 PM      Result Value Ref Range   hCG, Beta Chain, Quant, S 3069 (*) <5 mIU/mL   [place] Ob Comp Less 14 Wks  01/18/2014   CLINICAL DATA:  Vaginal spotting/cramping. Gestational age [redacted] weeks 2 days per LMP.  EXAM: OBSTETRIC <14 WK US AND TRANSVAGINAL OB US  TECHNIQUE: Both transabdominal and transvaginal ultrasound examinations were performed for complete evaluation of the gestation as well as the maternal uterus, adnexal regions, and pelvic cul-de-sac. Transvaginal technique was performed to assess early pregnancy.  COMPARISON:  None.  FINDINGS: Intrauterine gestational sac: Single and within normal.  Yolk sac:  Within normal.  Embryo:  Within normal.  Cardiac Activity: Within normal.  Heart Rate: 158 bpm  CRL:   13.5  mm   7 w 5 d                  US EDC: 09/01/2014  Possible very small adjacent subchorionic hemorrhage.  Maternal uterus/adnexae: Ovaries are within normal. There is no free fluid.  IMPRESSION: Single live IUP with estimated gestational age [redacted] weeks 5 days. Possible small subchorionic hemorrhage.   Electronically Signed   By: Elberta Fortisaniel  Boyle M.D.   On: 01/18/2014 15:21   [place] Ob Transvaginal  01/18/2014   CLINICAL DATA:  Vaginal spotting/cramping. Gestational age [redacted] weeks 2 days per LMP.  EXAM: OBSTETRIC <14 WK US AND TRANSVAGINAL  OB US  TECHNIQUE: Both transabdominal and transvaginal ultrasound examinations were performed for complete evaluation  of the gestation as well as the maternal uterus, adnexal regions, and pelvic cul-de-sac. Transvaginal technique was performed to assess early pregnancy.  COMPARISON:  None.  FINDINGS: Intrauterine gestational sac: Single and within normal.  Yolk sac:  Within normal.  Embryo:  Within normal.  Cardiac Activity: Within normal.  Heart Rate: 158 bpm  CRL:   13.5  mm   7 w 5 d                  [place] EDC: 09/01/2014  Possible very small adjacent subchorionic hemorrhage.  Maternal uterus/adnexae: Ovaries are within normal. There is no free fluid.  IMPRESSION: Single live IUP with estimated gestational age [redacted] weeks 5 days. Possible small subchorionic hemorrhage.   Electronically Signed   By: Elberta Fortis M.D.   On: 01/18/2014 15:21     MAU Course  Procedures  MDM Wet prep, GC, Chlamydia, U/S, CBC, UA, Quant  Assessment and Plan   A: Bleeding in early pregnancy Trich Hypertension  P:  Miscarriage Precautions Flagyl 2 Grams po to take at home Pt advised to start Prenatal care and advise MD of BP issues Phenergan 25 mg po q 6 hours  Carolynn Serve 01/18/2014, 2:16 PM

## 2014-01-18 NOTE — MAU Provider Note (Signed)
Attestation of Attending Supervision of Advanced Practitioner: Evaluation and management procedures were performed by the PA/NP/CNM/OB Fellow under my supervision/collaboration. Chart reviewed and agree with management and plan.  Tilda BurrowFERGUSON,Alven Alverio V 01/18/2014 7:54 PM

## 2014-01-18 NOTE — Discharge Instructions (Signed)
Arterial Hypertension Arterial hypertension (high blood pressure) is a condition of elevated pressure in your blood vessels. Hypertension over a long period of time is a risk factor for strokes, heart attacks, and heart failure. It is also the leading cause of kidney (renal) failure.  CAUSES   In Adults -- Over 90% of all hypertension has no known cause. This is called essential or primary hypertension. In the other 10% of people with hypertension, the increase in blood pressure is caused by another disorder. This is called secondary hypertension. Important causes of secondary hypertension are:  Heavy alcohol use.  Obstructive sleep apnea.  Hyperaldosterosim (Conn's syndrome).  Steroid use.  Chronic kidney failure.  Hyperparathyroidism.  Medications.  Renal artery stenosis.  Pheochromocytoma.  Cushing's disease.  Coarctation of the aorta.  Scleroderma renal crisis.  Licorice (in excessive amounts).  Drugs (cocaine, methamphetamine). Your caregiver can explain any items above that apply to you.  In Children -- Secondary hypertension is more common and should always be considered.  Pregnancy -- Few women of childbearing age have high blood pressure. However, up to 10% of them develop hypertension of pregnancy. Generally, this will not harm the woman. It may be a sign of 3 complications of pregnancy: preeclampsia, HELLP syndrome, and eclampsia. Follow up and control with medication is necessary. SYMPTOMS   This condition normally does not produce any noticeable symptoms. It is usually found during a routine exam.  Malignant hypertension is a late problem of high blood pressure. It may have the following symptoms:  Headaches.  Blurred vision.  End-organ damage (this means your kidneys, heart, lungs, and other organs are being damaged).  Stressful situations can increase the blood pressure. If a person with normal blood pressure has their blood pressure go up while  being seen by their caregiver, this is often termed "white coat hypertension." Its importance is not known. It may be related with eventually developing hypertension or complications of hypertension.  Hypertension is often confused with mental tension, stress, and anxiety. DIAGNOSIS  The diagnosis is made by 3 separate blood pressure measurements. They are taken at least 1 week apart from each other. If there is organ damage from hypertension, the diagnosis may be made without repeat measurements. Hypertension is usually identified by having blood pressure readings:  Above 140/90 mmHg measured in both arms, at 3 separate times, over a couple weeks.  Over 130/80 mmHg should be considered a risk factor and may require treatment in patients with diabetes. Blood pressure readings over 120/80 mmHg are called "pre-hypertension" even in non-diabetic patients. To get a true blood pressure measurement, use the following guidelines. Be aware of the factors that can alter blood pressure readings.  Take measurements at least 1 hour after caffeine.  Take measurements 30 minutes after smoking and without any stress. This is another reason to quit smoking  it raises your blood pressure.  Use a proper cuff size. Ask your caregiver if you are not sure about your cuff size.  Most home blood pressure cuffs are automatic. They will measure systolic and diastolic pressures. The systolic pressure is the pressure reading at the start of sounds. Diastolic pressure is the pressure at which the sounds disappear. If you are elderly, measure pressures in multiple postures. Try sitting, lying or standing.  Sit at rest for a minimum of 5 minutes before taking measurements.  You should not be on any medications like decongestants. These are found in many cold medications.  Record your blood pressure readings and review  them with your caregiver. If you have hypertension:  Your caregiver may do tests to be sure you do  not have secondary hypertension (see "causes" above).  Your caregiver may also look for signs of metabolic syndrome. This is also called Syndrome X or Insulin Resistance Syndrome. You may have this syndrome if you have type 2 diabetes, abdominal obesity, and abnormal blood lipids in addition to hypertension.  Your caregiver will take your medical and family history and perform a physical exam.  Diagnostic tests may include blood tests (for glucose, cholesterol, potassium, and kidney function), a urinalysis, or an EKG. Other tests may also be necessary depending on your condition. PREVENTION  There are important lifestyle issues that you can adopt to reduce your chance of developing hypertension:  Maintain a normal weight.  Limit the amount of salt (sodium) in your diet.  Exercise often.  Limit alcohol intake.  Get enough potassium in your diet. Discuss specific advice with your caregiver.  Follow a DASH diet (dietary approaches to stop hypertension). This diet is rich in fruits, vegetables, and low-fat dairy products, and avoids certain fats. PROGNOSIS  Essential hypertension cannot be cured. Lifestyle changes and medical treatment can lower blood pressure and reduce complications. The prognosis of secondary hypertension depends on the underlying cause. Many people whose hypertension is controlled with medicine or lifestyle changes can live a normal, healthy life.  RISKS AND COMPLICATIONS  While high blood pressure alone is not an illness, it often requires treatment due to its short- and long-term effects on many organs. Hypertension increases your risk for:  CVAs or strokes (cerebrovascular accident).  Heart failure due to chronically high blood pressure (hypertensive cardiomyopathy).  Heart attack (myocardial infarction).  Damage to the retina (hypertensive retinopathy).  Kidney failure (hypertensive nephropathy). Your caregiver can explain list items above that apply to you.  Treatment of hypertension can significantly reduce the risk of complications. TREATMENT   For overweight patients, weight loss and regular exercise are recommended. Physical fitness lowers blood pressure.  Mild hypertension is usually treated with diet and exercise. A diet rich in fruits and vegetables, fat-free dairy products, and foods low in fat and salt (sodium) can help lower blood pressure. Decreasing salt intake decreases blood pressure in a 1/3 of people.  Stop smoking if you are a smoker. The steps above are highly effective in reducing blood pressure. While these actions are easy to suggest, they are difficult to achieve. Most patients with moderate or severe hypertension end up requiring medications to bring their blood pressure down to a normal level. There are several classes of medications for treatment. Blood pressure pills (antihypertensives) will lower blood pressure by their different actions. Lowering the blood pressure by 10 mmHg may decrease the risk of complications by as much as 25%. The goal of treatment is effective blood pressure control. This will reduce your risk for complications. Your caregiver will help you determine the best treatment for you according to your lifestyle. What is excellent treatment for one person, may not be for you. HOME CARE INSTRUCTIONS   Do not smoke.  Follow the lifestyle changes outlined in the "Prevention" section.  If you are on medications, follow the directions carefully. Blood pressure medications must be taken as prescribed. Skipping doses reduces their benefit. It also puts you at risk for problems.  Follow up with your caregiver, as directed.  If you are asked to monitor your blood pressure at home, follow the guidelines in the "Diagnosis" section above. [place]  IF:   You think you are having medication side effects.  You have recurrent headaches or lightheadedness.  You have swelling in your ankles.  You have  trouble with your vision. SEEK IMMEDIATE MEDICAL CARE IF:   You have sudden onset of chest pain or pressure, difficulty breathing, or other symptoms of a heart attack.  You have a severe headache.  You have symptoms of a stroke (such as sudden weakness, difficulty speaking, difficulty walking). MAKE SURE YOU:   Understand these instructions.  Will watch your condition.  Will get help right away if you are not doing well or get worse. Document Released: 11/07/2005 Document Revised: 01/30/2012 Document Reviewed: 06/07/2007 [facility]ExitCare Patient Information 2014 [place], [place]. Trichinosis Trichinosis is an infection caused by eating the raw, or poorly cooked, meat of meat-eating animals that are infected. The infection is caused by eating the encysted larvae (like a small egg with a tiny worm inside) of the nematode (very small worm) called Trichinella spiralis. It is found in the infected meat of these animals. The seriousness of the disease is usually related to the number of larvae eaten. Once the cyst is in the intestines, the larva is released. These tiny worms then enter the small blood vessels in the intestines and travel throughout the body. They can infect all tissues of the body. SYMPTOMS  When the larvae are in the intestine, they can cause diarrhea and abdominal (belly) pain. There may be swelling around the eyes and muscle aches and pains. The diaphragm (breathing muscle between the chest and abdomen), chest muscles between the ribs, and the muscles of the face and the tongue are also affected. This disease is usually self limited. That means you will usually get well in time without treatment. It can rarely result in death only if there are complications from heavy invasion of the heart, lungs, or central nervous system (brain and spinal cord). DIAGNOSIS  Laboratory blood tests are available that can usually make a positive diagnosis. Examining the infected muscle under the microscope  may also help with the diagnosis. If laboratory work is performed, make sure you know how you are to obtain the results. It is your responsibility to follow up and obtain your laboratory results. TREATMENT   There is no known treatment for Trichinosis except for treatment of symptoms. Usually anti-inflammatory medications are used.  Only take over-the-counter or prescription medicines for pain, discomfort, or fever as directed by your caregiver. Discontinue immediately if you have stomach upset. Take medicine only as directed by your caregiver.  Thiabendazole is used as a medication for known exposure. Steroids are also used for severe cases.  Most symptoms will get better on their own within a year without lasting problems. However, you will be infected for the rest of your life. You cannot pass this infection on to another person even with close personal contact. SEEK IMMEDIATE MEDICAL CARE IF:  You develop any new symptoms such as vomiting, severe headache, stiff or painful neck, chest pain, shortness of breath, trouble breathing, or develop pain uncontrolled with medications.  You develop new problems or worsening of the problems that originally brought you in for care. Document Released: 02/13/2001 Document Revised: 01/30/2012 Document Reviewed: 03/13/2008 [facility]ExitCare Patient Information 2014 [place], [place]. Threatened Miscarriage Bleeding during the first 20 weeks of pregnancy is common. This is sometimes called a threatened miscarriage. This is a pregnancy that is threatening to end before the twentieth week of pregnancy. Often this bleeding stops with bed rest or decreased  activities as suggested by your caregiver and the pregnancy continues without any more problems. You may be asked to not have sexual intercourse, have orgasms or use tampons until further notice. Sometimes a threatened miscarriage can progress to a complete or incomplete miscarriage. This may or may not require further  treatment. Some miscarriages occur before a woman misses a menstrual period and knows she is pregnant. Miscarriages occur in 15 to 20% of all pregnancies and usually occur during the first 13 weeks of the pregnancy. The exact cause of a miscarriage is usually never known. A miscarriage is natures way of ending a pregnancy that is abnormal or would not make it to term. There are some things that may put you at risk to have a miscarriage, such as:  Hormone problems.  Infection of the uterus or cervix.  Chronic illness, diabetes for example, especially if it is not controlled.  Abnormal shaped uterus.  Fibroids in the uterus.  Incompetent cervix (the cervix is too weak to hold the baby).  Smoking.  Drinking too much alcohol. It's best not to drink any alcohol when you are pregnant.  Taking illegal drugs. TREATMENT  When a miscarriage becomes complete and all products of conception (all the tissue in the uterus) have been passed, often no treatment is needed. If you think you passed tissue, save it in a container and take it to your doctor for evaluation. If the miscarriage is incomplete (parts of the fetus or placenta remain in the uterus), further treatment may be needed. The most common reason for further treatment is continued bleeding (hemorrhage) because pregnancy tissue did not pass out of the uterus. This often occurs if a miscarriage is incomplete. Tissue left behind may also become infected. Treatment usually is dilatation and curettage (the removal of the remaining products of pregnancy. This can be done by a simple sucking procedure (suction curettage) or a simple scraping of the inside of the uterus. This may be done in the hospital or in the caregiver's office. This is only done when your caregiver knows that there is no chance for the pregnancy to proceed to term. This is determined by physical examination, negative pregnancy test, falling pregnancy hormone count and/or, an ultrasound  revealing a dead fetus. Miscarriages are often a very emotional time for prospective mothers and fathers. This is not you or your partners fault. It did not occur because of an inadequacy in you or your partner. Nearly all miscarriages occur because the pregnancy has started off wrongly. At least half of these pregnancies have a chromosomal abnormality. It is almost always not inherited. Others may have developmental problems with the fetus or placenta. This does not always show up even when the products miscarried are studied under the microscope. The miscarriage is nearly always not your fault and it is not likely that you could have prevented it from happening. If you are having emotional and grieving problems, talk to your health care provider and even seek counseling, if necessary, before getting pregnant again. You can begin trying for another pregnancy as soon as your caregiver says it is OK. HOME CARE INSTRUCTIONS   Your caregiver may order bed rest depending on how much bleeding and cramping you are having. You may be limited to only getting up to go to the bathroom. You may be allowed to continue light activity. You may need to make arrangements for the care of your other children and for any other responsibilities.  Keep track of the number of  pads you use each day, how often you have to change pads and how saturated (soaked) they are. Record this information.  DO NOT USE TAMPONS. Do not douche, have sexual intercourse or orgasms until approved by your caregiver.  You may receive a follow up appointment for re-evaluation of your pregnancy and a repeat blood test. Re-evaluation often occurs after 2 days and again in 4 to 6 weeks. It is very important that you follow-up in the recommended time period.  If you are Rh negative and the father is Rh positive or you do not know the fathers' blood type, you may receive a shot (Rh immune globulin) to help prevent abnormal antibodies that can develop  and affect the baby in any future pregnancies. SEEK IMMEDIATE MEDICAL CARE IF:  You have severe cramps in your stomach, back, or abdomen.  You have a sudden onset of severe pain in the lower part of your abdomen.  You develop chills.  You run an unexplained temperature of 101 F (38.3 C) or higher.  You pass large clots or tissue. Save any tissue for your caregiver to inspect.  Your bleeding increases or you become light-headed, weak, or have fainting episodes.  You have a gush of fluid from your vagina.  You pass out. This could mean you have a tubal (ectopic) pregnancy. Document Released: 11/07/2005 Document Revised: 01/30/2012 Document Reviewed: 06/23/2008 [facility] Patient Information 2014 [place], [place].

## 2014-01-18 NOTE — MAU Note (Signed)
Pt states had +UPT at home on 12/07/2013, began spotting Thursday. Wearing panty liner however notes pink blood when wiping only. Hx of miscarriage.

## 2014-01-20 ENCOUNTER — Inpatient Hospital Stay (HOSPITAL_COMMUNITY)
Admission: AD | Admit: 2014-01-20 | Discharge: 2014-01-20 | Disposition: A | Discharge status: Home / Self Care | Payer: BC Managed Care – PPO | Source: Ambulatory Visit | Attending: Obstetrics & Gynecology | Admitting: Obstetrics & Gynecology

## 2014-01-20 ENCOUNTER — Encounter (HOSPITAL_COMMUNITY): Payer: Self-pay | Admitting: *Deleted

## 2014-01-20 DIAGNOSIS — O26859 Spotting complicating pregnancy, unspecified trimester: Secondary | ICD-10-CM | POA: Insufficient documentation

## 2014-01-20 DIAGNOSIS — O209 Hemorrhage in early pregnancy, unspecified: Secondary | ICD-10-CM

## 2014-01-20 DIAGNOSIS — R109 Unspecified abdominal pain: Secondary | ICD-10-CM | POA: Insufficient documentation

## 2014-01-20 LAB — GC/CHLAMYDIA PROBE AMP
CT PROBE, AMP APTIMA: NEGATIVE
GC PROBE AMP APTIMA: NEGATIVE

## 2014-01-20 NOTE — MAU Provider Note (Signed)
CSN: [id number]     Arrival date & time 01/20/14  1826 History   None    Chief Complaint  Patient presents with  . Vaginal Bleeding     (Consider location/radiation/quality/duration/timing/severity/associated sxs/prior Treatment) The history is provided by the patient.   Mackenzie Moreno is a 38 y.o. G3 P0 who presents to the MAU with spotting. She was here 2/28 and had an ultrasound that showed a 7 week 5 day viable IUP and a small SCH. Today she reports that she had some spotting and was concerned. She denies heavy bleeding, pain or other problems.  Past Medical History  Diagnosis Date  . Hypertension     on aldomet  . Anemia   . Missed ab current   Past Surgical History  Procedure Laterality Date  . Wisdom tooth extraction    . Cholecystectomy  2005  . Tonsillectomy  2007  . Dilation and evacuation  05/18/2012    Procedure: DILATATION AND EVACUATION;  Surgeon: Annamaria BootsMary Suzanne Miller, MD;  Location: [facility];  Service: Gynecology;  Laterality: N/A;   No family history on file. History  Substance Use Topics  . Smoking status: Never Smoker   . Smokeless tobacco: Not on file  . Alcohol Use: Yes     Comment: social   OB History   Grav Para Term Preterm Abortions TAB SAB Ect Mult Living   3 0 0 0 2 1 1 0 0 0      Review of Systems Negative except as stated in HPI   Allergies  Vicodin  Home Medications  No current outpatient prescriptions on file. BP 138/90  Pulse 92  Temp(Src) 98.9 F (37.2 C)  Resp 16  Ht 5\' 9"  (1.753 m)  Wt 246 lb 9.6 oz (111.857 kg)  BMI 36.40 kg/m2  LMP 11/21/2013 Physical Exam  Nursing note and vitals reviewed. Constitutional: She is oriented to person, place, and time. She appears well-developed and well-nourished. No distress.  HENT:  Head: Atraumatic.  Eyes: EOM are normal.  Neck: Neck supple.  Cardiovascular: Normal rate.   Pulmonary/Chest: Effort normal.  Abdominal: Soft. There is no tenderness.  Musculoskeletal: Normal range of  motion.  Neurological: She is alert and oriented to person, place, and time. No cranial nerve deficit.  Skin: Skin is warm and dry.  Psychiatric: She has a normal mood and affect. Her behavior is normal.    ED Course  Procedures  Informal bedside ultrasound shows IUP with cardiac activity. MDM  38 y.o. @ 7356w6d gestation by LMP with spotting and concerns due to past SAB. Discussed with the patient in detail threatened AB and first [facility] bleeding and [facility]. She voices understanding. She will start her prenatal care. She will return here as needed. Stable for discharge without bleeding at this time.

## 2014-01-20 NOTE — MAU Note (Addendum)
Pt G3 P0 at 8.4wks with spotting and cramping since last Thursday.  Seen in MAU on Saturday, today spotting is brownish to pinkish and has increased. Treated for Trichomonas.

## 2014-01-20 NOTE — Discharge Instructions (Signed)
Return if you have heavy bleeding, severe pain, fever or other problems.

## 2014-01-22 ENCOUNTER — Encounter (HOSPITAL_COMMUNITY): Payer: Self-pay | Admitting: *Deleted

## 2014-01-22 ENCOUNTER — Inpatient Hospital Stay (HOSPITAL_COMMUNITY)
Admission: AD | Admit: 2014-01-22 | Discharge: 2014-01-22 | Disposition: A | Discharge status: Home / Self Care | Payer: Self-pay | Source: Ambulatory Visit | Attending: Obstetrics and Gynecology | Admitting: Obstetrics and Gynecology

## 2014-01-22 ENCOUNTER — Inpatient Hospital Stay (HOSPITAL_COMMUNITY): Payer: No Typology Code available for payment source

## 2014-01-22 DIAGNOSIS — O208 Other hemorrhage in early pregnancy: Secondary | ICD-10-CM | POA: Insufficient documentation

## 2014-01-22 DIAGNOSIS — O034 Incomplete spontaneous abortion without complication: Secondary | ICD-10-CM

## 2014-01-22 DIAGNOSIS — O039 Complete or unspecified spontaneous abortion without complication: Secondary | ICD-10-CM

## 2014-01-22 DIAGNOSIS — R109 Unspecified abdominal pain: Secondary | ICD-10-CM | POA: Insufficient documentation

## 2014-01-22 NOTE — MAU Note (Signed)
Has been spotting, was here on Mon, was told had subchorionic hemorrhage.  This morning started cramping. Has passed a few clots today, bleeding is heavier

## 2014-01-22 NOTE — MAU Provider Note (Signed)
History     CSN: [id number]  Arrival date and time: 01/22/14 1746   First Provider Initiated Contact with Patient 01/22/14 1842      No chief complaint on file.  HPI This is a 38 y.o. female at [redacted]w[redacted]d who presents with cramping and heavier bleeding. Was seen Monday for bleeding and told she had a [facility].  Had an [place] on 2/28 showing a live 7.5week fetus.   RN Note:  Has been spotting, was here on Mon, was told had subchorionic hemorrhage. This morning started cramping. Has passed a few clots today, bleeding is heavier       OB History   Grav Para Term Preterm Abortions TAB SAB Ect Mult Living   3 0 0 0 2 1 1 0 0 0       Past Medical History  Diagnosis Date  . Hypertension     on aldomet  . Anemia   . Missed ab current    Past Surgical History  Procedure Laterality Date  . Wisdom tooth extraction    . Cholecystectomy  2005  . Tonsillectomy  2007  . Dilation and evacuation  05/18/2012    Procedure: DILATATION AND EVACUATION;  Surgeon: Annamaria Boots, MD;  Location: [facility];  Service: Gynecology;  Laterality: N/A;    History reviewed. No pertinent family history.  History  Substance Use Topics  . Smoking status: Former Games developer  . Smokeless tobacco: Not on file  . Alcohol Use: Yes     Comment: social, not while pregnant    Allergies:  Allergies  Allergen Reactions  . Vicodin [Hydrocodone-Acetaminophen] Rash    Prescriptions prior to admission  Medication Sig Dispense Refill  . carboxymethylcellulose (REFRESH PLUS) 0.5 % SOLN Place 1 drop into both eyes as needed (dry eyes).      . hydrocortisone cream 0.5 % Apply 1 application topically as needed for itching (rash).      . metroNIDAZOLE (FLAGYL) 500 MG tablet Take 1 tablet (500 mg total) by mouth 4 (four) times daily. Take all 4 tabs at one time  4 tablet  0  . Prenatal Vit-Fe Fumarate-FA (PRENATAL MULTIVITAMIN) TABS tablet Take 1 tablet by mouth daily at 12 noon.        Review of Systems  Constitutional:  Negative for fever and malaise/fatigue.  Gastrointestinal: Positive for abdominal pain. Negative for nausea, vomiting, diarrhea and constipation.  Genitourinary:       Vaginal bleeding with clots   Neurological: Negative for dizziness.   Physical Exam   Blood pressure 135/93, pulse 90, temperature 99.3 F (37.4 C), temperature source Oral, resp. rate 18, height 5\' 8"  (1.727 m), weight 110.678 kg (244 lb), last menstrual period 11/21/2013.  Physical Exam  Constitutional: She is oriented to person, place, and time. She appears well-developed and well-nourished. No distress.  HENT:  Head: Normocephalic.  Cardiovascular: Normal rate.   Respiratory: Effort normal.  GI: Soft. She exhibits no distension. There is tenderness (mild tenderness lower abdomen). There is no rebound and no guarding.  Genitourinary: Uterus normal. Vaginal discharge (moderate blood with one clot, grape sized) found.  Musculoskeletal: Normal range of motion.  Neurological: She is alert and oriented to person, place, and time.  Skin: Skin is warm and dry.  Psychiatric: She has a normal mood and affect.    MAU Course  Procedures  MDM  Dr Emelda Fear did bedside US, which appeared to be IUFD. But he ordered a formal one to be sure   US Ob  Comp Less 14 Wks  01/18/2014   CLINICAL DATA:  Vaginal spotting/cramping. Gestational age [redacted] weeks 2 days per LMP.  EXAM: OBSTETRIC <14 WK US AND TRANSVAGINAL OB US  TECHNIQUE: Both transabdominal and transvaginal ultrasound examinations were performed for complete evaluation of the gestation as well as the maternal uterus, adnexal regions, and pelvic cul-de-sac. Transvaginal technique was performed to assess early pregnancy.  COMPARISON:  None.  FINDINGS: Intrauterine gestational sac: Single and within normal.  Yolk sac:  Within normal.  Embryo:  Within normal.  Cardiac Activity: Within normal.  Heart Rate: 158 bpm  CRL:   13.5  mm   7 w 5 d                  US EDC: 09/01/2014  Possible  very small adjacent subchorionic hemorrhage.  Maternal uterus/adnexae: Ovaries are within normal. There is no free fluid.    IMPRESSION: Single live IUP with estimated gestational age [redacted] weeks 5 days. Possible small subchorionic hemorrhage.     Electronically Signed   By: Elberta Fortisaniel  Boyle M.D.   On: 01/18/2014 15:21     [place] Ob Transvaginal  01/22/2014   CLINICAL DATA:  Vaginal bleeding evaluate for viability  EXAM: TRANSVAGINAL OB ULTRASOUND  TECHNIQUE: Transvaginal ultrasound was performed for complete evaluation of the gestation as well as the maternal uterus, adnexal regions, and pelvic cul-de-sac.  COMPARISON:  US OB COMP LESS 14 WKS dated 01/18/2014  FINDINGS: Intrauterine gestational sac: Visualized/normal in shape. Appreciated in the lower uterine segment  Yolk sac:  Appreciated  Embryo:  Appreciated.  Cardiac Activity: Not appreciated  Heart Rate: Not detected bpm  CRL:   13.2  mm   7 w 5 d                  US EDC: 09/05/2014.  Maternal uterus/adnexae: Unremarkable    IMPRESSION: When correlated with prior recent ultrasound which demonstrated a fetal heart rate these findings are consistent with a nonviable intrauterine pregnancy likely during active abortion. These results were called by telephone at the time of interpretation on 01/22/2014 at 8:08 PM to Dr. Wynelle BourgeoisMARIE WILLIAMS , who verbally acknowledged these results.     Electronically Signed   By: Salome HolmesHector  Cooper M.D.   On: 01/22/2014 20:08    Assessment and Plan  A:  SIUP at 4866w6d        IUFD, incomplete abortion  P:  Discussed findings       Discussed options of expectant management, Cytotec, and D&C.       She elects to wait expectantly for now       Bleeding precautions       Follow up in clinic  [facility]ILL[facility],MARIE 01/22/2014, 7:58 PM

## 2014-01-22 NOTE — MAU Provider Note (Signed)
Attestation of Attending Supervision of Advanced Practitioner (CNM/NP): Evaluation and management procedures were performed by the Advanced Practitioner under my supervision and collaboration.  I have reviewed the Advanced Practitioner's note and chart, and I agree with the management and plan.  HARRAWAY-SMITH, Shell Blanchette 5:10 PM     

## 2014-01-22 NOTE — Discharge Instructions (Signed)
Incomplete Miscarriage °A miscarriage is the sudden loss of an unborn baby (fetus) before the 20th week of pregnancy. In an incomplete miscarriage, parts of the fetus or placenta (afterbirth) remain in the body.  °Having a miscarriage can be an emotional experience. Talk with your health care provider about any questions you may have about miscarrying, the grieving process, and your future pregnancy plans. °CAUSES  °· Problems with the fetal chromosomes that make it impossible for the baby to develop normally. Problems with the baby's genes or chromosomes are most often the result of errors that occur by chance as the embryo divides and grows. The problems are not inherited from the parents. °· Infection of the cervix or uterus. °· Hormone problems. °· Problems with the cervix, such as having an incompetent cervix. This is when the tissue in the cervix is not strong enough to hold the pregnancy. °· Problems with the uterus, such as an abnormally shaped uterus, uterine fibroids, or congenital abnormalities. °· Certain medical conditions. °· Smoking, drinking alcohol, or taking illegal drugs. °· Trauma. °SYMPTOMS  °· Vaginal bleeding or spotting, with or without cramps or pain. °· Pain or cramping in the abdomen or lower back. °· Passing fluid, tissue, or blood clots from the vagina. °DIAGNOSIS  °Your health care provider will perform a physical exam. You may also have an ultrasound to confirm the miscarriage. Blood or urine tests may also be ordered. °TREATMENT  °· Usually, a dilation and curettage (D&C) procedure is performed. During a D&C procedure, the cervix is widened (dilated) and any remaining fetal or placental tissue is gently removed from the uterus. °· Antibiotic medicines are prescribed if there is an infection. Other medicines may be given to reduce the size of the uterus (contract) if there is a lot of bleeding. °· If you have Rh negative blood and your baby was Rh positive, you will need an Rho(D)  immune globulin shot. This shot will protect any future baby from having Rh blood problems in future pregnancies. °· You may be confined to bed rest. This means you should stay in bed and only get up to use the bathroom. °HOME CARE INSTRUCTIONS  °· Rest as directed by your health care provider. °· Restrict activity as directed by your health care provider. You may be allowed to continue light activity if curettage was not done but you require further treatment. °· Keep track of the number of pads you use each day. Keep track of how soaked (saturated) they are. Record this information. °· Do not  use tampons. °· Do not douche or have sexual intercourse until approved by your health care provider. °· Keep all follow-up appointments for re-evaluation and continuing management. °· Only take over-the-counter or prescription medicines for pain, fever, or discomfort as directed by your health care provider. °· Take antibiotic medicine as directed by your health care provider. Make sure you finish it even if you start to feel better. °SEEK IMMEDIATE MEDICAL CARE IF:  °· You experience severe cramps in your stomach, back, or abdomen. °· You have an unexplained temperature (make sure to record these temperatures). °· You pass large clots or tissue (save these for your health care provider to inspect). °· Your bleeding increases. °· You become light-headed, weak, or have fainting episodes. °MAKE SURE YOU:  °· Understand these instructions. °· Will watch your condition. °· Will get help right away if you are not doing well or get worse. °Document Released: 11/07/2005 Document Revised: 08/28/2013 Document Reviewed: 06/06/2013 °  ExitCare® Patient Information ©2014 [employer]. ° °

## 2014-01-31 NOTE — MAU Provider Note (Signed)
`````  Attestation of Attending Supervision of Advanced Practitioner: Evaluation and management procedures were performed by the PA/NP/CNM/OB Fellow under my supervision/collaboration. Chart reviewed and agree with management and plan.  Tilda BurrowFERGUSON,Chere Babson V 01/31/2014 6:44 PM

## 2014-02-11 ENCOUNTER — Telehealth: Payer: Self-pay | Admitting: Obstetrics & Gynecology

## 2014-02-11 NOTE — Telephone Encounter (Signed)
Patient called to say she would be out of town and could not make the appointment. She stated she would be back in town after the 12th of April. I informed patient of next availed appointment. She voiced that she understood.

## 2014-02-27 ENCOUNTER — Encounter: Payer: Self-pay | Admitting: Obstetrics & Gynecology

## 2014-03-20 ENCOUNTER — Encounter: Payer: Self-pay | Admitting: Obstetrics & Gynecology

## 2014-03-20 ENCOUNTER — Ambulatory Visit (INDEPENDENT_AMBULATORY_CARE_PROVIDER_SITE_OTHER): Payer: No Typology Code available for payment source | Admitting: Obstetrics & Gynecology

## 2014-03-20 VITALS — BP 142/90 | HR 92 | Temp 99.1°F | Ht 68.0 in | Wt 248.3 lb

## 2014-03-20 DIAGNOSIS — R300 Dysuria: Secondary | ICD-10-CM

## 2014-03-20 DIAGNOSIS — N760 Acute vaginitis: Secondary | ICD-10-CM

## 2014-03-20 LAB — POCT URINALYSIS DIP (DEVICE)
Bilirubin Urine: NEGATIVE
Glucose, UA: NEGATIVE mg/dL
Hgb urine dipstick: NEGATIVE
Ketones, ur: NEGATIVE mg/dL
LEUKOCYTES UA: NEGATIVE
NITRITE: NEGATIVE
PH: 6.5 (ref 5.0–8.0)
PROTEIN: NEGATIVE mg/dL
Specific Gravity, Urine: 1.01 (ref 1.005–1.030)
UROBILINOGEN UA: 0.2 mg/dL (ref 0.0–1.0)

## 2014-03-20 MED ORDER — FLUCONAZOLE 150 MG PO TABS: 150.0000 mg | ORAL_TABLET | Freq: Once | ORAL | Status: DC

## 2014-03-20 NOTE — Patient Instructions (Signed)
Vaginitis Vaginitis is an inflammation of the vagina. It is most often caused by a change in the normal balance of the bacteria and yeast that live in the vagina. This change in balance causes an overgrowth of certain bacteria or yeast, which causes the inflammation. There are different types of vaginitis, but the most common types are:  Bacterial vaginosis.  Yeast infection (candidiasis).  Trichomoniasis vaginitis. This is a sexually transmitted infection (STI).  Viral vaginitis.  Atropic vaginitis.  Allergic vaginitis. CAUSES  The cause depends on the type of vaginitis. Vaginitis can be caused by:  Bacteria (bacterial vaginosis).  Yeast (yeast infection).  A parasite (trichomoniasis vaginitis)  A virus (viral vaginitis).  Low hormone levels (atrophic vaginitis). Low hormone levels can occur during pregnancy, breastfeeding, or after menopause.  Irritants, such as bubble baths, scented tampons, and feminine sprays (allergic vaginitis). Other factors can change the normal balance of the yeast and bacteria that live in the vagina. These include:  Antibiotic medicines.  Poor hygiene.  Diaphragms, vaginal sponges, spermicides, birth control pills, and intrauterine devices (IUD).  Sexual intercourse.  Infection.  Uncontrolled diabetes.  A weakened immune system. SYMPTOMS  Symptoms can vary depending on the cause of the vaginitis. Common symptoms include:  Abnormal vaginal discharge.  The discharge is white, gray, or yellow with bacterial vaginosis.  The discharge is thick, white, and cheesy with a yeast infection.  The discharge is frothy and yellow or greenish with trichomoniasis.  A bad vaginal odor.  The odor is fishy with bacterial vaginosis.  Vaginal itching, pain, or swelling.  Painful intercourse.  Pain or burning when urinating. Sometimes, there are no symptoms. TREATMENT  Treatment will vary depending on the type of infection.   Bacterial  vaginosis and trichomoniasis are often treated with antibiotic creams or pills.  Yeast infections are often treated with antifungal medicines, such as vaginal creams or suppositories.  Viral vaginitis has no cure, but symptoms can be treated with medicines that relieve discomfort. Your sexual partner should be treated as well.  Atrophic vaginitis may be treated with an estrogen cream, pill, suppository, or vaginal ring. If vaginal dryness occurs, lubricants and moisturizing creams may help. You may be told to avoid scented soaps, sprays, or douches.  Allergic vaginitis treatment involves quitting the use of the product that is causing the problem. Vaginal creams can be used to treat the symptoms. HOME CARE INSTRUCTIONS   Take all medicines as directed by your caregiver.  Keep your genital area clean and dry. Avoid soap and only rinse the area with water.  Avoid douching. It can remove the healthy bacteria in the vagina.  Do not use tampons or have sexual intercourse until your vaginitis has been treated. Use sanitary pads while you have vaginitis.  Wipe from front to back. This avoids the spread of bacteria from the rectum to the vagina.  Let air reach your genital area.  Wear cotton underwear to decrease moisture buildup.  Avoid wearing underwear while you sleep until your vaginitis is gone.  Avoid tight pants and underwear or nylons without a cotton panel.  Take off wet clothing (especially bathing suits) as soon as possible.  Use mild, non-scented products. Avoid using irritants, such as:  Scented feminine sprays.  Fabric softeners.  Scented detergents.  Scented tampons.  Scented soaps or bubble baths.  Practice safe sex and use condoms. Condoms may prevent the spread of trichomoniasis and viral vaginitis. SEEK MEDICAL CARE IF:   You have abdominal pain.  You   have a fever or persistent symptoms for more than 2 3 days.  You have a fever and your symptoms suddenly  get worse. Document Released: 09/04/2007 Document Revised: 08/01/2012 Document Reviewed: 04/19/2012 ExitCare Patient Information 2014 [employer].  

## 2014-03-20 NOTE — Progress Notes (Signed)
Pt. Here today to follow up for miscarriage 01/22/14. Pt. Feels as if she had passed all products of conception. Pt. Does have a c/o of burning with urination as well as a mild vaginal itching. Clean catch urinalysis performed. Wet prep to be done. Pt. Does not desire birth control.

## 2014-03-20 NOTE — Progress Notes (Signed)
Subjective:     Patient ID: Lilian ComaMonica T Humiston, female   DOB: [birth year], 38 y.o.   MRN: [id number]  HPI Pt was seen in Mar and had a completed miscarriage after.  She reports that she passed multiple clots and then her bleeding was very light. She has no further sx.  She presents today with c/o vaginal/vulvar itching and white discharge,   Review of Systems     Objective:   Physical Exam BP 142/90  Pulse 92  Temp(Src) 99.1 F (37.3 C) (Oral)  Ht 5\' 8"  (1.727 m)  Wt 248 lb 4.8 oz (112.628 kg)  BMI 37.76 kg/m2  LMP 03/03/2014  Breastfeeding? Unknown GU: EGBUS: no lesions Vagina: no blood in vault; thick white discharge noted in vault and at introitus Cervix: no lesion; no mucopurulent d/c         Assessment:     S/p SAB Yeast vulvovaginits     Plan:     Diflucan 150mg  q day F/u in 3 months for recurrent SAB eval  F/u wet smear and KOH

## 2014-03-21 LAB — WET PREP, GENITAL
TRICH WET PREP: NONE SEEN
WBC WET PREP: NONE SEEN
YEAST WET PREP: NONE SEEN

## 2014-03-31 ENCOUNTER — Telehealth: Payer: Self-pay

## 2014-03-31 MED ORDER — METRONIDAZOLE 500 MG PO TABS: 500.0000 mg | ORAL_TABLET | Freq: Two times a day (BID) | ORAL | Status: DC

## 2014-03-31 NOTE — Telephone Encounter (Addendum)
Pt.'s wet prep showed BV. Flagyl 500mg  BID X7days prescribed per protocol. Called pt. And informed her of results and of prescription at pharmacy. Pt. Verbalized understanding and gratitude. NO further questions or concerns.

## 2014-09-22 ENCOUNTER — Encounter: Payer: Self-pay | Admitting: Obstetrics & Gynecology

## 2015-02-24 ENCOUNTER — Other Ambulatory Visit: Payer: Self-pay | Admitting: Nurse Practitioner

## 2015-02-24 DIAGNOSIS — Z803 Family history of malignant neoplasm of breast: Secondary | ICD-10-CM

## 2015-02-24 DIAGNOSIS — Z1231 Encounter for screening mammogram for malignant neoplasm of breast: Secondary | ICD-10-CM

## 2015-03-20 ENCOUNTER — Ambulatory Visit
Admission: RE | Admit: 2015-03-20 | Discharge: 2015-03-20 | Disposition: A | Discharge status: Home / Self Care | Payer: BC Managed Care – PPO | Source: Ambulatory Visit | Attending: Nurse Practitioner | Admitting: Nurse Practitioner

## 2015-03-20 DIAGNOSIS — Z1231 Encounter for screening mammogram for malignant neoplasm of breast: Secondary | ICD-10-CM

## 2015-03-20 DIAGNOSIS — Z803 Family history of malignant neoplasm of breast: Secondary | ICD-10-CM

## 2015-04-21 ENCOUNTER — Inpatient Hospital Stay (HOSPITAL_COMMUNITY)
Admission: AD | Admit: 2015-04-21 | Discharge: 2015-04-21 | Disposition: A | Discharge status: Home / Self Care | Payer: BC Managed Care – PPO | Source: Ambulatory Visit | Attending: Family Medicine | Admitting: Family Medicine

## 2015-04-21 ENCOUNTER — Encounter (HOSPITAL_COMMUNITY): Payer: Self-pay | Admitting: *Deleted

## 2015-04-21 ENCOUNTER — Inpatient Hospital Stay (HOSPITAL_COMMUNITY): Payer: BC Managed Care – PPO

## 2015-04-21 DIAGNOSIS — B9689 Other specified bacterial agents as the cause of diseases classified elsewhere: Secondary | ICD-10-CM | POA: Diagnosis not present

## 2015-04-21 DIAGNOSIS — N760 Acute vaginitis: Secondary | ICD-10-CM

## 2015-04-21 DIAGNOSIS — O9989 Other specified diseases and conditions complicating pregnancy, childbirth and the puerperium: Secondary | ICD-10-CM | POA: Diagnosis not present

## 2015-04-21 DIAGNOSIS — R1030 Lower abdominal pain, unspecified: Secondary | ICD-10-CM | POA: Diagnosis present

## 2015-04-21 DIAGNOSIS — Z87891 Personal history of nicotine dependence: Secondary | ICD-10-CM | POA: Insufficient documentation

## 2015-04-21 DIAGNOSIS — O23591 Infection of other part of genital tract in pregnancy, first trimester: Secondary | ICD-10-CM | POA: Diagnosis not present

## 2015-04-21 DIAGNOSIS — O26899 Other specified pregnancy related conditions, unspecified trimester: Secondary | ICD-10-CM

## 2015-04-21 DIAGNOSIS — Z3A01 Less than 8 weeks gestation of pregnancy: Secondary | ICD-10-CM | POA: Diagnosis not present

## 2015-04-21 DIAGNOSIS — R109 Unspecified abdominal pain: Secondary | ICD-10-CM

## 2015-04-21 LAB — OB RESULTS CONSOLE GC/CHLAMYDIA: Gonorrhea: NEGATIVE

## 2015-04-21 LAB — URINALYSIS, ROUTINE W REFLEX MICROSCOPIC
Bilirubin Urine: NEGATIVE
GLUCOSE, UA: NEGATIVE mg/dL
Hgb urine dipstick: NEGATIVE
Ketones, ur: NEGATIVE mg/dL
LEUKOCYTES UA: NEGATIVE
Nitrite: NEGATIVE
PROTEIN: NEGATIVE mg/dL
SPECIFIC GRAVITY, URINE: 1.015 (ref 1.005–1.030)
Urobilinogen, UA: 0.2 mg/dL (ref 0.0–1.0)
pH: 5.5 (ref 5.0–8.0)

## 2015-04-21 LAB — CBC
HCT: 34.3 % — ABNORMAL LOW (ref 36.0–46.0)
Hemoglobin: 11.7 g/dL — ABNORMAL LOW (ref 12.0–15.0)
MCH: 31.9 pg (ref 26.0–34.0)
MCHC: 34.1 g/dL (ref 30.0–36.0)
MCV: 93.5 fL (ref 78.0–100.0)
PLATELETS: 320 10*3/uL (ref 150–400)
RBC: 3.67 MIL/uL — ABNORMAL LOW (ref 3.87–5.11)
RDW: 14 % (ref 11.5–15.5)
WBC: 10.2 10*3/uL (ref 4.0–10.5)

## 2015-04-21 LAB — HCG, QUANTITATIVE, PREGNANCY: HCG, BETA CHAIN, QUANT, S: 65235 m[IU]/mL — AB (ref <5)

## 2015-04-21 LAB — POCT PREGNANCY, URINE: PREG TEST UR: POSITIVE — AB

## 2015-04-21 LAB — WET PREP, GENITAL
Trich, Wet Prep: NONE SEEN
Yeast Wet Prep HPF POC: NONE SEEN

## 2015-04-21 MED ORDER — FLUCONAZOLE 150 MG PO TABS: 150.0000 mg | ORAL_TABLET | Freq: Once | ORAL | Status: DC

## 2015-04-21 MED ORDER — METRONIDAZOLE 500 MG PO TABS: 500.0000 mg | ORAL_TABLET | Freq: Two times a day (BID) | ORAL | Status: DC

## 2015-04-21 NOTE — MAU Note (Signed)
Stopped BP medicine 2 wks ago

## 2015-04-21 NOTE — Discharge Instructions (Signed)
First Trimester of Pregnancy °The first trimester of pregnancy is from week 1 until the end of week 12 (months 1 through 3). During this time, your baby will begin to develop inside you. At 6-8 weeks, the eyes and face are formed, and the heartbeat can be seen on ultrasound. At the end of 12 weeks, all the baby's organs are formed. Prenatal care is all the medical care you receive before the birth of your baby. Make sure you get good prenatal care and follow all of your doctor's instructions. °HOME CARE  °Medicines °· Take medicine only as told by your doctor. Some medicines are safe and some are not during pregnancy. °· Take your prenatal vitamins as told by your doctor. °· Take medicine that helps you poop (stool softener) as needed if your doctor says it is okay. °Diet °· Eat regular, healthy meals. °· Your doctor will tell you the amount of weight gain that is right for you. °· Avoid raw meat and uncooked cheese. °· If you feel sick to your stomach (nauseous) or throw up (vomit): °· Eat 4 or 5 small meals a day instead of 3 large meals. °· Try eating a few soda crackers. °· Drink liquids between meals instead of during meals. °· If you have a hard time pooping (constipation): °· Eat high-fiber foods like fresh vegetables, fruit, and whole grains. °· Drink enough fluids to keep your pee (urine) clear or pale yellow. °Activity and Exercise °· Exercise only as told by your doctor. Stop exercising if you have cramps or pain in your lower belly (abdomen) or low back. °· Try to avoid standing for long periods of time. Move your legs often if you must stand in one place for a long time. °· Avoid heavy lifting. °· Wear low-heeled shoes. Sit and stand up straight. °· You can have sex unless your doctor tells you not to. °Relief of Pain or Discomfort °· Wear a good support bra if your breasts are sore. °· Take warm water baths (sitz baths) to soothe pain or discomfort caused by hemorrhoids. Use hemorrhoid cream if your  doctor says it is okay. °· Rest with your legs raised if you have leg cramps or low back pain. °· Wear support hose if you have puffy, bulging veins (varicose veins) in your legs. Raise (elevate) your feet for 15 minutes, 3-4 times a day. Limit salt in your diet. °Prenatal Care °· Schedule your prenatal visits by the twelfth week of pregnancy. °· Write down your questions. Take them to your prenatal visits. °· Keep all your prenatal visits as told by your doctor. °Safety °· Wear your seat belt at all times when driving. °· Make a list of emergency phone numbers. The list should include numbers for family, friends, the hospital, and police and fire departments. °General Tips °· Ask your doctor for a referral to a local prenatal class. Begin classes no later than at the start of month 6 of your pregnancy. °· Ask for help if you need counseling or help with nutrition. Your doctor can give you advice or tell you where to go for help. °· Do not use hot tubs, steam rooms, or saunas. °· Do not douche or use tampons or scented sanitary pads. °· Do not cross your legs for long periods of time. °· Avoid litter boxes and soil used by cats. °· Avoid all smoking, herbs, and alcohol. Avoid drugs not approved by your doctor. °· Visit your dentist. At home, brush your teeth   with a soft toothbrush. Be gentle when you floss. °GET HELP IF: °· You are dizzy. °· You have mild cramps or pressure in your lower belly. °· You have a nagging pain in your belly area. °· You continue to feel sick to your stomach, throw up, or have watery poop (diarrhea). °· You have a bad smelling fluid coming from your vagina. °· You have pain with peeing (urination). °· You have increased puffiness (swelling) in your face, hands, legs, or ankles. °GET HELP RIGHT AWAY IF:  °· You have a fever. °· You are leaking fluid from your vagina. °· You have spotting or bleeding from your vagina. °· You have very bad belly cramping or pain. °· You gain or lose weight  rapidly. °· You throw up blood. It may look like coffee grounds. °· You are around people who have German measles, fifth disease, or chickenpox. °· You have a very bad headache. °· You have shortness of breath. °· You have any kind of trauma, such as from a fall or a car accident. °Document Released: 04/25/2008 Document Revised: 03/24/2014 Document Reviewed: 09/17/2013 °ExitCare® Patient Information ©2015 [employer]. This information is not intended to replace advice given to you by your health care provider. Make sure you discuss any questions you have with your health care provider. °Bacterial Vaginosis °Bacterial vaginosis is an infection of the vagina. It happens when too many of certain germs (bacteria) grow in the vagina. °HOME CARE °· Take your medicine as told by your doctor. °· Finish your medicine even if you start to feel better. °· Do not have sex until you finish your medicine and are better. °· Tell your sex partner that you have an infection. They should see their doctor for treatment. °· Practice safe sex. Use condoms. Have only one sex partner. °GET HELP IF: °· You are not getting better after 3 days of treatment. °· You have more grey fluid (discharge) coming from your vagina than before. °· You have more pain than before. °· You have a fever. °MAKE SURE YOU:  °· Understand these instructions. °· Will watch your condition. °· Will get help right away if you are not doing well or get worse. °Document Released: 08/16/2008 Document Revised: 08/28/2013 Document Reviewed: 06/19/2013 °ExitCare® Patient Information ©2015 [employer]. This information is not intended to replace advice given to you by your health care provider. Make sure you discuss any questions you have with your health care provider. ° °

## 2015-04-21 NOTE — MAU Provider Note (Signed)
History     CSN: [id number]  Arrival date and time: 04/21/15 1727   First Provider Initiated Contact with Patient 04/21/15 2115      Chief Complaint  Patient presents with  . Abdominal Pain   HPI  [name] is a 39 y.o. G4P0020 at [redacted]w[redacted]d who presents to MAU today with complaint of lower abdominal pain x 1-2 weeks. She states pain is cramping and rated at 5/10 now. She has not taken anything for pain. She denies vaginal bleeding, discharge, UTI symptoms, N/V/D or fever.   OB History    Gravida Para Term Preterm AB TAB SAB Ectopic Multiple Living        Past Medical History  Diagnosis Date  . Hypertension     on aldomet  . Anemia   . Missed ab current    Past Surgical History  Procedure Laterality Date  . Wisdom tooth extraction    . Cholecystectomy  2005  . Tonsillectomy  2007  . Dilation and evacuation  05/18/2012    Procedure: DILATATION AND EVACUATION;  Surgeon: Annamaria Boots, MD;  Location: [facility];  Service: Gynecology;  Laterality: N/A;    No family history on file.  History  Substance Use Topics  . Smoking status: Former Games developer  . Smokeless tobacco: Never Used  . Alcohol Use: Yes     Comment: social    Allergies:  Allergies  Allergen Reactions  . Vicodin [Hydrocodone-Acetaminophen] Rash    No prescriptions prior to admission    Review of Systems  Constitutional: Negative for fever and malaise/fatigue.  Gastrointestinal: Positive for abdominal pain. Negative for nausea, vomiting, diarrhea and constipation.  Genitourinary: Negative for dysuria, urgency and frequency.       Neg - vaginal bleeding, discharge   Physical Exam   Blood pressure 125/87, pulse 84, temperature 98.8 F (37.1 C), temperature source Oral, resp. rate 18, height 5' 8.5" (1.74 m), weight 242 lb (109.77 kg), last menstrual period 03/05/2015, unknown if currently breastfeeding.  Physical Exam  Nursing note and vitals reviewed. Constitutional:  She is oriented to person, place, and time. She appears well-developed and well-nourished. No distress.  HENT:  Head: Normocephalic and atraumatic.  Cardiovascular: Normal rate.   Respiratory: Effort normal.  GI: Soft. There is no tenderness.  Neurological: She is alert and oriented to person, place, and time.  Skin: Skin is warm and dry. No erythema.  Psychiatric: She has a normal mood and affect.   Results for orders placed or performed during the hospital encounter of 04/21/15 (from the past 24 hour(s))  Urinalysis, Routine w reflex microscopic (not at [facility])     Status: None   Collection Time: 04/21/15  6:10 PM  Result Value Ref Range   Color, Urine YELLOW YELLOW   APPearance CLEAR CLEAR   Specific Gravity, Urine 1.015 1.005 - 1.030   pH 5.5 5.0 - 8.0   Glucose, UA NEGATIVE NEGATIVE mg/dL   Hgb urine dipstick NEGATIVE NEGATIVE   Bilirubin Urine NEGATIVE NEGATIVE   Ketones, ur NEGATIVE NEGATIVE mg/dL   Protein, ur NEGATIVE NEGATIVE mg/dL   Urobilinogen, UA 0.2 0.0 - 1.0 mg/dL   Nitrite NEGATIVE NEGATIVE   Leukocytes, UA NEGATIVE NEGATIVE  Pregnancy, urine POC     Status: Abnormal   Collection Time: 04/21/15  6:35 PM  Result Value Ref Range   Preg Test, Ur POSITIVE (A) NEGATIVE  CBC     Status: Abnormal  Collection Time: 04/21/15  7:18 PM  Result Value Ref Range   WBC 10.2 4.0 - 10.5 K/uL   RBC 3.67 (L) 3.87 - 5.11 MIL/uL   Hemoglobin 11.7 (L) 12.0 - 15.0 g/dL   HCT 16.1 (L) 09.6 - 04.5 %   MCV 93.5 78.0 - 100.0 fL   MCH 31.9 26.0 - 34.0 pg   MCHC 34.1 30.0 - 36.0 g/dL   RDW 40.9 81.1 - 91.4 %   Platelets 320 150 - 400 K/uL  hCG, quantitative, pregnancy     Status: Abnormal   Collection Time: 04/21/15  7:18 PM  Result Value Ref Range   hCG, Beta Chain, Quant, S 65235 (H) <5 mIU/mL  Wet prep, genital     Status: Abnormal   Collection Time: 04/21/15  9:20 PM  Result Value Ref Range   Yeast Wet Prep HPF POC NONE SEEN NONE SEEN   Trich, Wet Prep NONE SEEN NONE SEEN    Clue Cells Wet Prep HPF POC FEW (A) NONE SEEN   WBC, Wet Prep HPF POC FEW (A) NONE SEEN   US Ob Comp Less 14 Wks  04/21/2015   CLINICAL DATA:  39 year old G4 P0 SAB2, TAB1, LMP 03/05/2015 (6 weeks 5 days), presenting with pelvic cramping but no vaginal bleeding. Quantitative beta HCG 65,235.  EXAM: OBSTETRIC <14 WK [place] AND TRANSVAGINAL OB US  TECHNIQUE: Both transabdominal and transvaginal ultrasound examinations were performed for complete evaluation of the gestation as well as the maternal uterus, adnexal regions, and pelvic cul-de-sac. Transvaginal technique was performed to assess early pregnancy.  COMPARISON:  None this gestation.  FINDINGS: Intrauterine gestational sac: Single, normal in shape.  Yolk sac:  Present.  Embryo:  Present.  Cardiac Activity: Present.  Heart Rate: 117  bpm  CRL:  4.2 mm   6 w   1 d                  [place] EDC: 12/14/2015  Maternal uterus/adnexae: Small subchorionic hemorrhage measuring approximately 1.3 x 0.8 x 0.8 cm. Right ovary measures approximately 2.9 x 2.2 x 2.6 cm and contains a small likely hemorrhagic corpus luteum cyst. Left ovary measures 2.8 x 1.7 x 2.2 cm, only visualized transabdominally. No adnexal masses or free pelvic fluid.  IMPRESSION: 1. Single live intrauterine embryo with estimated gestational age [redacted] weeks 1 day by crown-rump length. Ultrasound [facility] 12/14/2015. This correlates well with the estimated gestational age of [redacted] weeks 5 days by LMP. 2. Small subchorionic hemorrhage. 3. No adnexal masses or free pelvic fluid.   Electronically Signed   By: Hulan Saas M.D.   On: 04/21/2015 21:05   US Ob Transvaginal  04/21/2015   CLINICAL DATA:  38 year old G4 P0 SAB2, TAB1, LMP 03/05/2015 (6 weeks 5 days), presenting with pelvic cramping but no vaginal bleeding. Quantitative beta HCG 65,235.  EXAM: OBSTETRIC <14 WK [place] AND TRANSVAGINAL OB US  TECHNIQUE: Both transabdominal and transvaginal ultrasound examinations were performed for complete evaluation of the  gestation as well as the maternal uterus, adnexal regions, and pelvic cul-de-sac. Transvaginal technique was performed to assess early pregnancy.  COMPARISON:  None this gestation.  FINDINGS: Intrauterine gestational sac: Single, normal in shape.  Yolk sac:  Present.  Embryo:  Present.  Cardiac Activity: Present.  Heart Rate: 117  bpm  CRL:  4.2 mm   6 w   1 d                  [place] EDC:  12/14/2015  Maternal uterus/adnexae: Small subchorionic hemorrhage measuring approximately 1.3 x 0.8 x 0.8 cm. Right ovary measures approximately 2.9 x 2.2 x 2.6 cm and contains a small likely hemorrhagic corpus luteum cyst. Left ovary measures 2.8 x 1.7 x 2.2 cm, only visualized transabdominally. No adnexal masses or free pelvic fluid.  IMPRESSION: 1. Single live intrauterine embryo with estimated gestational age [redacted] weeks 1 day by crown-rump length. [facility] This correlates well with the estimated gestational age of [redacted] weeks 5 days by LMP. 2. Small subchorionic hemorrhage. 3. No adnexal masses or free pelvic fluid.   Electronically Signed   By: Hulan Saashomas  Lawrence M.D.   On: 04/21/2015 21:05    MAU Course  Procedures None  MDM +UPT UA, wet prep, GC/chlamydia, CBC, ABO/Rh, quant hCG, HIV, RPR and US today to rule out ectopic pregnancy  Assessment and Plan  A: SIUP at 1451w5d Abdominal pain in pregnancy, first trimester Bacterial vaginosis  P: Discharge home Rx for Flagyl given to patient Rx for Diflucan sent to patient's pharmacy per her request First trimester precautions discussed Patient advised to start taking prenatal vitamins Patient advised to follow-up with OB provider of choice to start prenatal care List of are OB providers given with pregnancy confirmation letter Patient may return to MAU as needed or if her condition were to change or worsen   Marny LowensteinJulie N Wenzel, PA-C  04/21/2015, 10:57 PM

## 2015-04-21 NOTE — MAU Note (Signed)
Having cramps, more intense this week. Feels like menstrual cycle. Has had 2 prior miscarriages, os it is concerning her.

## 2015-04-22 LAB — GC/CHLAMYDIA PROBE AMP (~~LOC~~) NOT AT ARMC
Chlamydia: NEGATIVE
Neisseria Gonorrhea: NEGATIVE

## 2015-04-22 LAB — HIV ANTIBODY (ROUTINE TESTING W REFLEX): HIV SCREEN 4TH GENERATION: NONREACTIVE

## 2015-05-21 ENCOUNTER — Telehealth: Payer: Self-pay | Admitting: Obstetrics

## 2015-05-22 ENCOUNTER — Other Ambulatory Visit: Payer: Self-pay | Admitting: Certified Nurse Midwife

## 2015-05-22 ENCOUNTER — Encounter: Payer: BC Managed Care – PPO | Admitting: Certified Nurse Midwife

## 2015-05-22 ENCOUNTER — Ambulatory Visit (INDEPENDENT_AMBULATORY_CARE_PROVIDER_SITE_OTHER): Payer: BC Managed Care – PPO | Admitting: Certified Nurse Midwife

## 2015-05-22 ENCOUNTER — Encounter: Payer: Self-pay | Admitting: Certified Nurse Midwife

## 2015-05-22 VITALS — BP 130/91 | HR 101 | Temp 98.7°F | Wt 245.0 lb

## 2015-05-22 DIAGNOSIS — Z3682 Encounter for antenatal screening for nuchal translucency: Secondary | ICD-10-CM

## 2015-05-22 DIAGNOSIS — O09519 Supervision of elderly primigravida, unspecified trimester: Secondary | ICD-10-CM | POA: Insufficient documentation

## 2015-05-22 DIAGNOSIS — Z3491 Encounter for supervision of normal pregnancy, unspecified, first trimester: Secondary | ICD-10-CM | POA: Diagnosis not present

## 2015-05-22 DIAGNOSIS — O09511 Supervision of elderly primigravida, first trimester: Secondary | ICD-10-CM

## 2015-05-22 DIAGNOSIS — I10 Essential (primary) hypertension: Secondary | ICD-10-CM | POA: Insufficient documentation

## 2015-05-22 LAB — COMPREHENSIVE METABOLIC PANEL
ALK PHOS: 55 U/L (ref 39–117)
ALT: 14 U/L (ref 0–35)
AST: 12 U/L (ref 0–37)
Albumin: 3.9 g/dL (ref 3.5–5.2)
BUN: 8 mg/dL (ref 6–23)
CALCIUM: 9.4 mg/dL (ref 8.4–10.5)
CO2: 25 mEq/L (ref 19–32)
CREATININE: 0.69 mg/dL (ref 0.50–1.10)
Chloride: 99 mEq/L (ref 96–112)
Glucose, Bld: 84 mg/dL (ref 70–99)
Potassium: 4.1 mEq/L (ref 3.5–5.3)
Sodium: 137 mEq/L (ref 135–145)
Total Bilirubin: 0.3 mg/dL (ref 0.2–1.2)
Total Protein: 6.7 g/dL (ref 6.0–8.3)

## 2015-05-22 LAB — POCT URINALYSIS DIPSTICK
Bilirubin, UA: NEGATIVE
Glucose, UA: NORMAL
KETONES UA: NEGATIVE
Leukocytes, UA: NEGATIVE
NITRITE UA: NEGATIVE
PROTEIN UA: NEGATIVE
RBC UA: NEGATIVE
SPEC GRAV UA: 1.015
Urobilinogen, UA: NEGATIVE
pH, UA: 5

## 2015-05-22 MED ORDER — LABETALOL HCL 300 MG PO TABS: 300.0000 mg | ORAL_TABLET | Freq: Two times a day (BID) | ORAL | Status: DC

## 2015-05-22 NOTE — Progress Notes (Signed)
Subjective:    Mackenzie Moreno is being seen today for her first obstetrical visit.  This is not a planned pregnancy. She is at [redacted]w[redacted]d gestation. Her obstetrical history is significant for multiple early SABs. Relationship with FOB: significant other, not living together. Patient does intend to breast feed. Pregnancy history fully reviewed.  The information documented in the HPI was reviewed and verified.  Menstrual History: OB History    Gravida Para Term Preterm AB TAB SAB Ectopic Multiple Living       March 2015 SAB around 6-8 weeks  June 2013 D&E for SAB around 6-8 weeks  2010 had TAB, misoprostol abortion.    Menarche age: 6-14  Patient's last menstrual period was 03/05/2015.    Past Medical History  Diagnosis Date  . Hypertension     on aldomet  . Anemia   . Missed ab current    Past Surgical History  Procedure Laterality Date  . Wisdom tooth extraction    . Cholecystectomy  2005  . Tonsillectomy  2007  . Dilation and evacuation  05/18/2012    Procedure: DILATATION AND EVACUATION;  Surgeon: Annamaria Boots, MD;  Location: [facility];  Service: Gynecology;  Laterality: N/A;     (Not in a hospital admission) Allergies  Allergen Reactions  . Vicodin [Hydrocodone-Acetaminophen] Rash    History  Substance Use Topics  . Smoking status: Former Games developer  . Smokeless tobacco: Never Used  . Alcohol Use: No     Comment: social    Family History  Problem Relation Age of Onset  . Cancer Mother   . COPD Mother   . Hypertension Mother   . Heart disease Mother   . Heart disease Father   . Hypertension Brother      Review of Systems Constitutional: negative for weight loss Gastrointestinal: negative for vomiting Genitourinary:negative for genital lesions and vaginal discharge and dysuria Musculoskeletal:negative for back pain Behavioral/Psych: negative for abusive relationship, depression, illegal drug usage and tobacco use    Objective:    BP  130/91 mmHg  Pulse 101  Temp(Src) 98.7 F (37.1 C)  Wt 245 lb (111.131 kg)  LMP 03/05/2015 General Appearance:    Alert, cooperative, no distress, appears stated age  Head:    Normocephalic, without obvious abnormality, atraumatic  Eyes:    PERRL, conjunctiva/corneas clear, EOM's intact, fundi    benign, both eyes  Ears:    Normal TM's and external ear canals, both ears  Nose:   Nares normal, septum midline, mucosa normal, no drainage    or sinus tenderness  Throat:   Lips, mucosa, and tongue normal; teeth and gums normal  Neck:   Supple, symmetrical, trachea midline, no adenopathy;    thyroid:  no enlargement/tenderness/nodules; no carotid   bruit or JVD  Back:     Symmetric, no curvature, ROM normal, no CVA tenderness  Lungs:     Clear to auscultation bilaterally, respirations unlabored  Chest Wall:    No tenderness or deformity   Heart:    Regular rate and rhythm, S1 and S2 normal, no murmur, rub   or gallop  Breast Exam:    No tenderness, masses, or nipple abnormality  Abdomen:     Soft, non-tender, bowel sounds active all four quadrants,    no masses, no organomegaly  Genitalia:    Normal female without lesion, discharge or tenderness  Extremities:   Extremities normal, atraumatic, no cyanosis or edema  Pulses:   2+ and symmetric all extremities  Skin:   Skin color, texture, turgor normal, no rashes or lesions  Lymph nodes:   Cervical, supraclavicular, and axillary nodes normal  Neurologic:   CNII-XII intact, normal strength, sensation and reflexes    throughout    Cervix:  Long, thick, closed and posterior.     Lab Review Urine pregnancy test Labs reviewed yes Radiologic studies reviewed yes Assessment:    Pregnancy at 7263w1d weeks   Doing well.  Chronic Hypertension   Plan:    Changed HTN meds from HCTZ to Labetalol Prenatal vitamins.  Counseling provided regarding continued use of seat belts, cessation of alcohol consumption, smoking or use of illicit drugs;  infection precautions i.e., influenza/TDAP immunizations, toxoplasmosis,CMV, parvovirus, listeria and varicella; workplace safety, exercise during pregnancy; routine dental care, safe medications, sexual activity, hot tubs, saunas, pools, travel, caffeine use, fish and methlymercury, potential toxins, hair treatments, varicose veins Weight gain recommendations per IOM guidelines reviewed: underweight/BMI< 18.5--> gain 28 - 40 lbs; normal weight/BMI 18.5 - 24.9--> gain 25 - 35 lbs; overweight/BMI 25 - 29.9--> gain 15 - 25 lbs; obese/BMI >30->gain  11 - 20 lbs Problem list reviewed and updated. FIRST/CF mutation testing/NIPT/QUAD SCREEN/fragile X/Ashkenazi Jewish population testing/Spinal muscular atrophy discussed: requested. Role of ultrasound in pregnancy discussed; fetal survey: requested. Amniocentesis discussed: not indicated. VBAC calculator score: VBAC consent form provided Meds ordered this encounter  Medications  . labetalol (NORMODYNE) 300 MG tablet    Sig: Take 1 tablet (300 mg total) by mouth 2 (two) times daily.    Dispense:  60 tablet    Refill:  12   Orders Placed This Encounter  Procedures  . Culture, OB Urine  . SureSwab, Vaginosis/Vaginitis Plus  . Prenatal (OB Panel)  . HIV antibody  . Hemoglobinopathy evaluation  . Varicella zoster antibody, IgG  . Vit D  25 hydroxy (rtn osteoporosis monitoring)  . TSH  . Comprehensive metabolic panel  . AMB referral to maternal fetal medicine    Referral Priority:  Routine    Referral Type:  Consultation    Number of Visits Requested:  1  . POCT urinalysis dipstick    Follow up in 4 weeks. 50% of 30 min visit spent on counseling and coordination of care.

## 2015-05-23 LAB — TSH: TSH: 1.559 u[IU]/mL (ref 0.350–4.500)

## 2015-05-23 LAB — VITAMIN D 25 HYDROXY (VIT D DEFICIENCY, FRACTURES): Vit D, 25-Hydroxy: 21 ng/mL — ABNORMAL LOW (ref 30–100)

## 2015-05-23 LAB — CULTURE, OB URINE
Colony Count: NO GROWTH
Organism ID, Bacteria: NO GROWTH

## 2015-05-23 LAB — HIV ANTIBODY (ROUTINE TESTING W REFLEX): HIV: NONREACTIVE

## 2015-05-26 LAB — OBSTETRIC PANEL
Antibody Screen: NEGATIVE
BASOS ABS: 0 10*3/uL (ref 0.0–0.1)
BASOS PCT: 0 % (ref 0–1)
EOS ABS: 0.3 10*3/uL (ref 0.0–0.7)
Eosinophils Relative: 3 % (ref 0–5)
HCT: 35.7 % — ABNORMAL LOW (ref 36.0–46.0)
HEMOGLOBIN: 12.1 g/dL (ref 12.0–15.0)
HEP B S AG: NEGATIVE
Lymphocytes Relative: 32 % (ref 12–46)
Lymphs Abs: 3.2 10*3/uL (ref 0.7–4.0)
MCH: 31.3 pg (ref 26.0–34.0)
MCHC: 33.9 g/dL (ref 30.0–36.0)
MCV: 92.5 fL (ref 78.0–100.0)
MPV: 9.5 fL (ref 8.6–12.4)
Monocytes Absolute: 1.1 10*3/uL — ABNORMAL HIGH (ref 0.1–1.0)
Monocytes Relative: 11 % (ref 3–12)
NEUTROS PCT: 54 % (ref 43–77)
Neutro Abs: 5.3 10*3/uL (ref 1.7–7.7)
Platelets: 345 10*3/uL (ref 150–400)
RBC: 3.86 MIL/uL — ABNORMAL LOW (ref 3.87–5.11)
RDW: 13.8 % (ref 11.5–15.5)
Rh Type: POSITIVE
Rubella: 7.21 Index — ABNORMAL HIGH (ref <0.90)
WBC: 9.9 10*3/uL (ref 4.0–10.5)

## 2015-05-26 LAB — VARICELLA ZOSTER ANTIBODY, IGG: Varicella IgG: <10 Index (ref <135.00)

## 2015-05-27 LAB — HEMOGLOBINOPATHY EVALUATION
HGB A2 QUANT: 2.4 % (ref 2.2–3.2)
HGB F QUANT: 0.7 % (ref 0.0–2.0)
Hemoglobin Other: 0 %
Hgb A: 96.9 % (ref 96.8–97.8)
Hgb S Quant: 0 %

## 2015-05-27 LAB — PAP IG AND HPV HIGH-RISK: HPV DNA HIGH RISK: NOT DETECTED

## 2015-05-28 ENCOUNTER — Encounter: Payer: Self-pay | Admitting: Certified Nurse Midwife

## 2015-05-28 ENCOUNTER — Other Ambulatory Visit: Payer: Self-pay | Admitting: Certified Nurse Midwife

## 2015-05-28 DIAGNOSIS — B373 Candidiasis of vulva and vagina: Secondary | ICD-10-CM

## 2015-05-28 DIAGNOSIS — B3731 Acute candidiasis of vulva and vagina: Secondary | ICD-10-CM

## 2015-05-28 LAB — SURESWAB, VAGINOSIS/VAGINITIS PLUS
Atopobium vaginae: NOT DETECTED Log (cells/mL)
C. GLABRATA, DNA: NOT DETECTED
C. PARAPSILOSIS, DNA: NOT DETECTED
C. TRACHOMATIS RNA, TMA: NOT DETECTED
C. TROPICALIS, DNA: NOT DETECTED
C. albicans, DNA: DETECTED — AB
Gardnerella vaginalis: <4.7 Log (cells/mL)
LACTOBACILLUS SPECIES: 6.3 Log (cells/mL)
MEGASPHAERA SPECIES: NOT DETECTED Log (cells/mL)
N. gonorrhoeae RNA, TMA: NOT DETECTED
T. VAGINALIS RNA, QL TMA: NOT DETECTED

## 2015-05-28 MED ORDER — MICONAZOLE NITRATE 1200 & 2 MG & % VA KIT: 1.0000 | PACK | Freq: Once | VAGINAL | Status: DC

## 2015-05-29 NOTE — Telephone Encounter (Signed)
05/29/2015 - Patient made new appt and follow up appts. brm

## 2015-06-05 ENCOUNTER — Ambulatory Visit (HOSPITAL_COMMUNITY)
Admission: RE | Admit: 2015-06-05 | Discharge: 2015-06-05 | Disposition: A | Discharge status: Home / Self Care | Payer: BC Managed Care – PPO | Source: Ambulatory Visit | Attending: Certified Nurse Midwife | Admitting: Certified Nurse Midwife

## 2015-06-05 ENCOUNTER — Encounter (HOSPITAL_COMMUNITY): Payer: Self-pay

## 2015-06-05 ENCOUNTER — Ambulatory Visit (HOSPITAL_COMMUNITY): Admission: RE | Admit: 2015-06-05 | Payer: BC Managed Care – PPO | Source: Ambulatory Visit

## 2015-06-05 ENCOUNTER — Ambulatory Visit (HOSPITAL_COMMUNITY)
Admission: RE | Admit: 2015-06-05 | Discharge: 2015-06-05 | Disposition: A | Discharge status: Home / Self Care | Payer: BC Managed Care – PPO | Source: Ambulatory Visit | Attending: Physician Assistant | Admitting: Physician Assistant

## 2015-06-05 ENCOUNTER — Other Ambulatory Visit: Payer: Self-pay | Admitting: Certified Nurse Midwife

## 2015-06-05 DIAGNOSIS — Z3A13 13 weeks gestation of pregnancy: Secondary | ICD-10-CM

## 2015-06-05 DIAGNOSIS — Z315 Encounter for genetic counseling: Secondary | ICD-10-CM | POA: Insufficient documentation

## 2015-06-05 DIAGNOSIS — O09521 Supervision of elderly multigravida, first trimester: Secondary | ICD-10-CM | POA: Diagnosis not present

## 2015-06-05 DIAGNOSIS — Z3682 Encounter for antenatal screening for nuchal translucency: Secondary | ICD-10-CM

## 2015-06-05 DIAGNOSIS — O09511 Supervision of elderly primigravida, first trimester: Secondary | ICD-10-CM

## 2015-06-05 NOTE — Progress Notes (Signed)
Genetic Counseling  High-Risk Gestation Note  Appointment Date:  06/05/2015 Referred By: Mackenzie Moreno, CNM Date of Birth:  1976/03/25   Pregnancy History: G4P0030 Estimated Date of Delivery: 12/10/15 Estimated Gestational Age: 5965w1d Attending: Ledon SnareBrian Brost, MD   Mackenzie Moreno was seen for genetic counseling because of Moreno maternal age of 39 y.o..     In Summary:  Patient is advanced maternal age; Discussed 1 in 66 risk for fetal aneuploidy  NT ultrasound today; NT within normal range  Fetal echogenic bowel visualized  Discussed differentials for fetal echogenic bowel  Patient declined additional screening/testing today (NIPS, First screen, CF screen, TORCH titers, amniocentesis)  Detailed ultrasound scheduled for 07/10/15  Patient may consider further bloodwork screening pending detailed ultrasound results (including possibly Panorama and additional fetal echogenic bowel work-up)  Patient declines amniocentesis  She was counseled regarding maternal age and the association with risk for chromosome conditions due to nondisjunction with aging of the ova.   We reviewed chromosomes, nondisjunction, and the associated 1 in 666 risk for fetal aneuploidy related to Moreno maternal age of 39 y.o. at 1965w1d gestation.  She was counseled that the risk for aneuploidy decreases as gestational age increases, accounting for those pregnancies which spontaneously abort.  We specifically discussed Down syndrome (trisomy 2821), trisomies 2713 and 3418, and sex chromosome aneuploidies (47,XXX and 47,XXY) including the common features and prognoses of each.   We reviewed available screening options including First Screen, Quad screen, noninvasive prenatal screening (NIPS)/cell free DNA (cfDNA) testing, and detailed ultrasound.  She was counseled that screening tests are used to modify Moreno patient's Moreno priori risk for aneuploidy, typically based on age. This estimate provides Moreno pregnancy specific risk assessment.  We reviewed the benefits and limitations of each option. Specifically, we discussed the conditions for which each test screens, the detection rates, and false positive rates of each. She was also counseled regarding diagnostic testing via amniocentesis. We reviewed the approximate 1 in 300-500 risk for complications for amniocentesis, including spontaneous pregnancy loss.   Nuchal translucency ultrasound was performed at the time of today's visit. NT measurement was within normal range. Fetal echogenic bowel visualized today. Complete ultrasound report under separate cover.   We discussed that an echogenic bowel refers to an area in the fetal intestines that appears brighter, or denser, than the liver and/or bone on ultrasound. Echogenic bowel is detected in approximately 1% of fetuses at the time of Moreno second trimester ultrasound evaluation.  Most of the time, this brightness does not cause any problems and will spontaneously resolve.  Possible causes of an echogenic bowel include undergrowth of the bowel, an obstruction or blockage of the intestines, the fetus swallowing Moreno small amount of blood present in the amniotic fluid, an intrauterine infection, Moreno chromosome condition, cystic fibrosis, or Moreno normal variation in development.   Mackenzie Moreno reported bleeding early in the pregnancy but no bleeding since that time. Ultrasound reportedly previously visualized subchorionic hemorrhage. She also denied known exposure to infections during the pregnancy. We discussed the option of maternal TORCH titers and that amniocentesis could also assess for evidence of infections. We briefly reviewed cystic fibrosis (CF) including the typical features and prognosis and the autosomal recessive inheritance of the condition. The carrier frequency in the African American population is approximately 1 in 3665.  The presence of echogenic bowel on prenatal ultrasound is associated with an increased risk for CF. We discussed the option  of carrier screening for CF. When both parents are  identified carriers, prenatal diagnosis via amniocentesis would be available. Additionally, we discussed that CF is included on the newborn screening panel in [place]. We discussed that the presence of echogenic bowel on ultrasound increases the chance for fetal Down syndrome from the patient's age related risk of ~1 in 133 to approximately 1 in 1.   After consideration of all the options, Mackenzie Moreno declined additional screening at the time of today's appointment. She elected to return for detailed ultrasound, which is scheduled for 07/07/15. She stated that pending results of that ultrasound she would possibly consider NIPS (Panorama), and would possibly consider further work-up for fetal echogenic bowel (CF carrier screening, TORCH titers) at that time as well. Mackenzie Moreno declined amniocentesis.  She understands that screening tests cannot rule out all birth defects or genetic syndromes. The patient was advised of this limitation and states she still does not want additional testing at this time.   Mackenzie Moreno was provided with written information regarding sickle cell anemia (SCA) including the carrier frequency and incidence in the African-American population, the availability of carrier testing and prenatal diagnosis if indicated.  In addition, we discussed that hemoglobinopathies are routinely screened for as part of the  newborn screening panel.  Hemoglobin electrophoresis was previously performed and was within normal limits.   Both family histories were reviewed and found to be contributory for Moreno niece with autism (the patient's paternal half-brother's daughter). No underlying etiology is known for her autism. She was not described to have dysmorphic features. She is currently 39 years old and otherwise healthy. We discussed that autism is part of the spectrum of conditions referred to as Autistic spectrum disorders (ASD). ASDs are among the most common  neurodevelopmental disorders, with approximately 1 in 68 children meeting criteria for ASD, according to the Centers for Disease Control. Approximately 80% of individuals diagnosed are female. There is strong evidence that genetic factors play Moreno critical role in development of ASD. There have been recent advances in identifying specific genetic causes of ASD, however, there are still many individuals for whom the etiology of the ASD is not known. The majority of individuals with ASD (70-80%) have essential autism. There is strong evidence that genetic factors play Moreno critical role in development of ASD. Some individuals with ASDs are found to have causative differences in karyotype analysis, chromosomal microarray analysis, or single genes. These are more likely to be identified in individuals with complex autism spectrum disorders.  We discussed that in the absence of an identified etiology, recurrence risk estimate data for extended degree relatives is limited. Given the reported family history, recurrence risk for the current pregnancy would likely be similar to the general population risk. Ms. Derks understands that prenatal screening and testing is not available for ASD at this time, in the absence of an identified etiology in the family. Without further information regarding the provided family history, an accurate genetic risk cannot be calculated. Further genetic counseling is warranted if more information is obtained.  Ms. Willenbring denied exposure to environmental toxins or chemical agents. She denied the use of alcohol, tobacco or street drugs. She denied significant viral illnesses during the course of her pregnancy. Her medical and surgical histories were contributory hypertension, for which she is taking labetalol.   I counseled Mackenzie Moreno regarding the above risks and available options.  The approximate face-to-face time with the genetic counselor was 40 minutes.  Quinn Plowman, MS,   Certified Genetic Counselor 06/05/2015

## 2015-06-05 NOTE — ED Notes (Signed)
Pt reported some light pink discharge this am, currently being treated for a yeast infection.

## 2015-06-19 ENCOUNTER — Ambulatory Visit (INDEPENDENT_AMBULATORY_CARE_PROVIDER_SITE_OTHER): Payer: BC Managed Care – PPO | Admitting: Certified Nurse Midwife

## 2015-06-19 VITALS — BP 132/89 | HR 96 | Temp 97.9°F | Wt 257.0 lb

## 2015-06-19 DIAGNOSIS — Z3402 Encounter for supervision of normal first pregnancy, second trimester: Secondary | ICD-10-CM

## 2015-06-19 LAB — POCT URINALYSIS DIPSTICK
BILIRUBIN UA: NEGATIVE
GLUCOSE UA: NEGATIVE
Ketones, UA: NEGATIVE
LEUKOCYTES UA: NEGATIVE
Nitrite, UA: NEGATIVE
PH UA: 6
Protein, UA: NEGATIVE
RBC UA: NEGATIVE
Spec Grav, UA: 1.015
Urobilinogen, UA: NEGATIVE

## 2015-06-19 NOTE — Progress Notes (Signed)
  Subjective:    Mackenzie Moreno is a 39 y.o. female being seen today for her obstetrical visit. She is at [redacted]w[redacted]d gestation. Patient reports: no complaints.  Problem List Items Addressed This Visit    None    Visit Diagnoses    Encounter for supervision of normal first pregnancy in second trimester    -  Primary    Relevant Orders    POCT urinalysis dipstick (Completed)      Patient Active Problem List   Diagnosis Date Noted  . Encounter for (NT) nuchal translucency scan   . [redacted] weeks gestation of pregnancy   . High blood pressure 05/22/2015  . Supervision of high-risk pregnancy of elderly primigravida (>=66 years old at delivery) 05/22/2015    Objective:     BP 132/89 mmHg  Pulse 96  Temp(Src) 97.9 F (36.6 C)  Wt 257 lb (116.574 kg)  LMP 03/05/2015 Uterine Size: Below umbilicus   FHR: 160 by doppler  Assessment:    Pregnancy @ [redacted]w[redacted]d  weeks Doing well    Plan:    Problem list reviewed and updated. Labs reviewed.  Follow up in 4 weeks. FIRST/CF mutation testing/NIPT/QUAD SCREEN/fragile X/Ashkenazi Jewish population testing/Spinal muscular atrophy discussed: declined today, wants to wait until [place] in August. Role of ultrasound in pregnancy discussed; fetal survey: requested is already scheduled with MFM for August.   Amniocentesis discussed: not indicated. 50% of 15 minute visit spent on counseling and coordination of care.

## 2015-07-08 ENCOUNTER — Other Ambulatory Visit: Payer: Self-pay | Admitting: Certified Nurse Midwife

## 2015-07-08 DIAGNOSIS — Z3A18 18 weeks gestation of pregnancy: Secondary | ICD-10-CM

## 2015-07-08 DIAGNOSIS — Z3689 Encounter for other specified antenatal screening: Secondary | ICD-10-CM

## 2015-07-08 DIAGNOSIS — O10912 Unspecified pre-existing hypertension complicating pregnancy, second trimester: Secondary | ICD-10-CM

## 2015-07-08 DIAGNOSIS — O09522 Supervision of elderly multigravida, second trimester: Secondary | ICD-10-CM

## 2015-07-10 ENCOUNTER — Other Ambulatory Visit: Payer: Self-pay | Admitting: Certified Nurse Midwife

## 2015-07-10 ENCOUNTER — Encounter (HOSPITAL_COMMUNITY): Payer: Self-pay

## 2015-07-10 ENCOUNTER — Other Ambulatory Visit (HOSPITAL_COMMUNITY): Payer: Self-pay | Admitting: *Deleted

## 2015-07-10 ENCOUNTER — Ambulatory Visit (HOSPITAL_COMMUNITY)
Admission: RE | Admit: 2015-07-10 | Discharge: 2015-07-10 | Disposition: A | Discharge status: Home / Self Care | Payer: BC Managed Care – PPO | Source: Ambulatory Visit | Attending: Physician Assistant | Admitting: Physician Assistant

## 2015-07-10 DIAGNOSIS — O99212 Obesity complicating pregnancy, second trimester: Secondary | ICD-10-CM | POA: Insufficient documentation

## 2015-07-10 DIAGNOSIS — Z3A18 18 weeks gestation of pregnancy: Secondary | ICD-10-CM | POA: Insufficient documentation

## 2015-07-10 DIAGNOSIS — Z3689 Encounter for other specified antenatal screening: Secondary | ICD-10-CM

## 2015-07-10 DIAGNOSIS — I10 Essential (primary) hypertension: Secondary | ICD-10-CM | POA: Insufficient documentation

## 2015-07-10 DIAGNOSIS — O10912 Unspecified pre-existing hypertension complicating pregnancy, second trimester: Secondary | ICD-10-CM | POA: Insufficient documentation

## 2015-07-10 DIAGNOSIS — O09522 Supervision of elderly multigravida, second trimester: Secondary | ICD-10-CM | POA: Insufficient documentation

## 2015-07-10 DIAGNOSIS — E669 Obesity, unspecified: Secondary | ICD-10-CM | POA: Insufficient documentation

## 2015-07-10 DIAGNOSIS — Z36 Encounter for antenatal screening of mother: Secondary | ICD-10-CM | POA: Insufficient documentation

## 2015-07-10 DIAGNOSIS — O169 Unspecified maternal hypertension, unspecified trimester: Secondary | ICD-10-CM

## 2015-07-14 ENCOUNTER — Other Ambulatory Visit: Payer: Self-pay | Admitting: Certified Nurse Midwife

## 2015-07-17 ENCOUNTER — Other Ambulatory Visit: Payer: Self-pay | Admitting: Certified Nurse Midwife

## 2015-07-24 ENCOUNTER — Encounter: Payer: BC Managed Care – PPO | Admitting: Certified Nurse Midwife

## 2015-07-24 ENCOUNTER — Ambulatory Visit (INDEPENDENT_AMBULATORY_CARE_PROVIDER_SITE_OTHER): Payer: BC Managed Care – PPO | Admitting: Certified Nurse Midwife

## 2015-07-24 VITALS — BP 127/87 | HR 89 | Temp 97.5°F | Wt 256.0 lb

## 2015-07-24 DIAGNOSIS — O09522 Supervision of elderly multigravida, second trimester: Secondary | ICD-10-CM

## 2015-07-24 NOTE — Progress Notes (Signed)
Subjective:    Mackenzie Moreno is a 39 y.o. female being seen today for her obstetrical visit. She is at [redacted]w[redacted]d gestation. Patient reports: no complaints . Fetal movement: normal.  Problem List Items Addressed This Visit    None     Patient Active Problem List   Diagnosis Date Noted  . Encounter for (NT) nuchal translucency scan   . [redacted] weeks gestation of pregnancy   . High blood pressure 05/22/2015  . Supervision of high-risk pregnancy of elderly primigravida (>=38 years old at delivery) 05/22/2015   Objective:    BP 127/87 mmHg  Pulse 89  Temp(Src) 97.5 F (36.4 C)  Wt 256 lb (116.121 kg)  LMP 03/05/2015 FHT: 135 BPM  Uterine Size: size equals dates and at U     Assessment:    Pregnancy @ [redacted]w[redacted]d    Plan:    OBGCT: discussed., preterm labor precautions given  Labs, problem list reviewed and updated 2 hr GTT planned Follow up in 4 weeks.

## 2015-08-05 ENCOUNTER — Encounter: Payer: Self-pay | Admitting: Certified Nurse Midwife

## 2015-08-07 ENCOUNTER — Other Ambulatory Visit: Payer: Self-pay | Admitting: Certified Nurse Midwife

## 2015-08-07 ENCOUNTER — Ambulatory Visit (HOSPITAL_COMMUNITY)
Admission: RE | Admit: 2015-08-07 | Discharge: 2015-08-07 | Disposition: A | Discharge status: Home / Self Care | Payer: BC Managed Care – PPO | Source: Ambulatory Visit | Attending: Certified Nurse Midwife | Admitting: Certified Nurse Midwife

## 2015-08-07 ENCOUNTER — Encounter (HOSPITAL_COMMUNITY): Payer: Self-pay

## 2015-08-07 ENCOUNTER — Other Ambulatory Visit (HOSPITAL_COMMUNITY): Payer: Self-pay | Admitting: Obstetrics and Gynecology

## 2015-08-07 DIAGNOSIS — O169 Unspecified maternal hypertension, unspecified trimester: Secondary | ICD-10-CM

## 2015-08-07 DIAGNOSIS — Z36 Encounter for antenatal screening of mother: Secondary | ICD-10-CM | POA: Diagnosis not present

## 2015-08-07 DIAGNOSIS — O358XX0 Maternal care for other (suspected) fetal abnormality and damage, not applicable or unspecified: Secondary | ICD-10-CM

## 2015-08-07 DIAGNOSIS — Z0489 Encounter for examination and observation for other specified reasons: Secondary | ICD-10-CM

## 2015-08-07 DIAGNOSIS — O99212 Obesity complicating pregnancy, second trimester: Secondary | ICD-10-CM | POA: Diagnosis not present

## 2015-08-07 DIAGNOSIS — Z3A22 22 weeks gestation of pregnancy: Secondary | ICD-10-CM

## 2015-08-07 DIAGNOSIS — O09522 Supervision of elderly multigravida, second trimester: Secondary | ICD-10-CM

## 2015-08-07 DIAGNOSIS — IMO0002 Reserved for concepts with insufficient information to code with codable children: Secondary | ICD-10-CM

## 2015-08-07 DIAGNOSIS — O10912 Unspecified pre-existing hypertension complicating pregnancy, second trimester: Secondary | ICD-10-CM | POA: Diagnosis not present

## 2015-08-07 DIAGNOSIS — E669 Obesity, unspecified: Secondary | ICD-10-CM | POA: Diagnosis not present

## 2015-08-21 ENCOUNTER — Ambulatory Visit (INDEPENDENT_AMBULATORY_CARE_PROVIDER_SITE_OTHER): Payer: BC Managed Care – PPO | Admitting: Obstetrics

## 2015-08-21 ENCOUNTER — Encounter: Payer: BC Managed Care – PPO | Admitting: Certified Nurse Midwife

## 2015-08-21 ENCOUNTER — Encounter: Payer: Self-pay | Admitting: Obstetrics

## 2015-08-21 VITALS — BP 120/81 | HR 97 | Temp 97.8°F | Wt 260.0 lb

## 2015-08-21 DIAGNOSIS — Z3402 Encounter for supervision of normal first pregnancy, second trimester: Secondary | ICD-10-CM

## 2015-08-21 LAB — POCT URINALYSIS DIPSTICK
BILIRUBIN UA: NEGATIVE
Blood, UA: NEGATIVE
GLUCOSE UA: NEGATIVE
KETONES UA: NEGATIVE
LEUKOCYTES UA: NEGATIVE
NITRITE UA: NEGATIVE
Protein, UA: NEGATIVE
Spec Grav, UA: 1.02
Urobilinogen, UA: NEGATIVE
pH, UA: 5.5

## 2015-08-21 NOTE — Progress Notes (Signed)
Subjective:    Mackenzie Moreno is a 39 y.o. female being seen today for her obstetrical visit. She is at [redacted]w[redacted]d gestation. Patient reports: no complaints . Fetal movement: normal.  Problem List Items Addressed This Visit    None    Visit Diagnoses    Encounter for supervision of normal first pregnancy in second trimester    -  Primary    Relevant Orders    POCT urinalysis dipstick (Completed)      Patient Active Problem List   Diagnosis Date Noted  . Encounter for (NT) nuchal translucency scan   . [redacted] weeks gestation of pregnancy   . High blood pressure 05/22/2015  . Supervision of high-risk pregnancy of elderly primigravida (>=71 years old at delivery) 05/22/2015   Objective:    BP 120/81 mmHg  Pulse 97  Temp(Src) 97.8 F (36.6 C)  Wt 260 lb (117.935 kg)  LMP 03/05/2015 FHT: 150 BPM  Uterine Size: size equals dates     Assessment:    Pregnancy @ [redacted]w[redacted]d    Plan:    OBGCT: ordered for next visit.  Labs, problem list reviewed and updated 2 hr GTT planned Follow up in 4 weeks.

## 2015-08-24 ENCOUNTER — Telehealth: Payer: Self-pay

## 2015-08-24 NOTE — Telephone Encounter (Signed)
Patient will pick up FMLA papers - she paid the 15.00 - called her 10/3 am

## 2015-09-04 ENCOUNTER — Ambulatory Visit (HOSPITAL_COMMUNITY)
Admission: RE | Admit: 2015-09-04 | Discharge: 2015-09-04 | Disposition: A | Discharge status: Home / Self Care | Payer: BC Managed Care – PPO | Source: Ambulatory Visit | Attending: Certified Nurse Midwife | Admitting: Certified Nurse Midwife

## 2015-09-04 DIAGNOSIS — O169 Unspecified maternal hypertension, unspecified trimester: Secondary | ICD-10-CM

## 2015-09-09 ENCOUNTER — Other Ambulatory Visit: Payer: BC Managed Care – PPO

## 2015-09-09 ENCOUNTER — Ambulatory Visit (INDEPENDENT_AMBULATORY_CARE_PROVIDER_SITE_OTHER): Payer: BC Managed Care – PPO | Admitting: Certified Nurse Midwife

## 2015-09-09 VITALS — BP 122/85 | HR 88 | Temp 98.6°F | Wt 263.0 lb

## 2015-09-09 DIAGNOSIS — O2690 Pregnancy related conditions, unspecified, unspecified trimester: Secondary | ICD-10-CM

## 2015-09-09 DIAGNOSIS — Z3402 Encounter for supervision of normal first pregnancy, second trimester: Secondary | ICD-10-CM

## 2015-09-09 LAB — CBC
HCT: 34.6 % — ABNORMAL LOW (ref 36.0–46.0)
Hemoglobin: 11.5 g/dL — ABNORMAL LOW (ref 12.0–15.0)
MCH: 30.3 pg (ref 26.0–34.0)
MCHC: 33.2 g/dL (ref 30.0–36.0)
MCV: 91.3 fL (ref 78.0–100.0)
MPV: 9.6 fL (ref 8.6–12.4)
PLATELETS: 306 10*3/uL (ref 150–400)
RBC: 3.79 MIL/uL — AB (ref 3.87–5.11)
RDW: 13.6 % (ref 11.5–15.5)
WBC: 7 10*3/uL (ref 4.0–10.5)

## 2015-09-09 LAB — POCT URINALYSIS DIPSTICK
Bilirubin, UA: NEGATIVE
Blood, UA: NEGATIVE
Glucose, UA: NEGATIVE
KETONES UA: NEGATIVE
LEUKOCYTES UA: NEGATIVE
NITRITE UA: NEGATIVE
PH UA: 7
PROTEIN UA: NEGATIVE
Spec Grav, UA: <=1.005
Urobilinogen, UA: NEGATIVE

## 2015-09-09 NOTE — Progress Notes (Signed)
Subjective:    Mackenzie ComaMonica T Moreno is a 39 y.o. female being seen today for her obstetrical visit. She is at 834w6d gestation. Patient reports: no complaints . Fetal movement: normal.  Work is going well.  Form signed for PAL donation.   Problem List Items Addressed This Visit    None    Visit Diagnoses    Encounter for supervision of normal first pregnancy in second trimester    -  Primary    Relevant Orders    Glucose Tolerance, 2 Hours w/1 Hour    CBC    HIV antibody    RPR    POCT urinalysis dipstick (Completed)      Patient Active Problem List   Diagnosis Date Noted  . Encounter for (NT) nuchal translucency scan   . [redacted] weeks gestation of pregnancy   . High blood pressure 05/22/2015  . Supervision of high-risk pregnancy of elderly primigravida (>=37 years old at delivery) 05/22/2015   Objective:    BP 122/85 mmHg  Pulse 88  Temp(Src) 98.6 F (37 C)  Wt 263 lb (119.296 kg)  LMP 03/05/2015 FHT: 158 BPM  Uterine Size: 30 cm and size greater than dates, difficult to assess d/t maternal habitus     Assessment:    Pregnancy @ 654w6d    Doing well Plan:    OBGCT: ordered. Signs and symptoms of preterm labor: discussed.  Labs, problem list reviewed and updated 2 hr GTT planned Follow up in 2 weeks.

## 2015-09-09 NOTE — Progress Notes (Signed)
Pt states that she is doing well. No concerns today.

## 2015-09-10 LAB — GLUCOSE TOLERANCE, 2 HOURS W/ 1HR
GLUCOSE, 2 HOUR: 105 mg/dL (ref 70–139)
Glucose, 1 hour: 99 mg/dL (ref 70–170)
Glucose, Fasting: 75 mg/dL (ref 65–99)

## 2015-09-10 LAB — RPR

## 2015-09-10 LAB — HIV ANTIBODY (ROUTINE TESTING W REFLEX): HIV 1&2 Ab, 4th Generation: NONREACTIVE

## 2015-09-13 ENCOUNTER — Inpatient Hospital Stay (HOSPITAL_COMMUNITY)
Admission: AD | Admit: 2015-09-13 | Discharge: 2015-09-13 | Disposition: A | Discharge status: Home / Self Care | Payer: BC Managed Care – PPO | Source: Ambulatory Visit | Attending: Obstetrics | Admitting: Obstetrics

## 2015-09-13 ENCOUNTER — Encounter (HOSPITAL_COMMUNITY): Payer: Self-pay

## 2015-09-13 DIAGNOSIS — R102 Pelvic and perineal pain: Secondary | ICD-10-CM | POA: Diagnosis not present

## 2015-09-13 DIAGNOSIS — Z3A27 27 weeks gestation of pregnancy: Secondary | ICD-10-CM | POA: Diagnosis not present

## 2015-09-13 DIAGNOSIS — R140 Abdominal distension (gaseous): Secondary | ICD-10-CM | POA: Diagnosis present

## 2015-09-13 DIAGNOSIS — K30 Functional dyspepsia: Secondary | ICD-10-CM | POA: Diagnosis not present

## 2015-09-13 DIAGNOSIS — O26892 Other specified pregnancy related conditions, second trimester: Secondary | ICD-10-CM | POA: Diagnosis not present

## 2015-09-13 DIAGNOSIS — O10912 Unspecified pre-existing hypertension complicating pregnancy, second trimester: Secondary | ICD-10-CM | POA: Diagnosis not present

## 2015-09-13 DIAGNOSIS — N949 Unspecified condition associated with female genital organs and menstrual cycle: Secondary | ICD-10-CM | POA: Diagnosis not present

## 2015-09-13 DIAGNOSIS — O9989 Other specified diseases and conditions complicating pregnancy, childbirth and the puerperium: Secondary | ICD-10-CM

## 2015-09-13 DIAGNOSIS — O26899 Other specified pregnancy related conditions, unspecified trimester: Secondary | ICD-10-CM

## 2015-09-13 DIAGNOSIS — Z87891 Personal history of nicotine dependence: Secondary | ICD-10-CM | POA: Diagnosis not present

## 2015-09-13 DIAGNOSIS — O09522 Supervision of elderly multigravida, second trimester: Secondary | ICD-10-CM | POA: Insufficient documentation

## 2015-09-13 LAB — URINALYSIS, ROUTINE W REFLEX MICROSCOPIC
Bilirubin Urine: NEGATIVE
GLUCOSE, UA: NEGATIVE mg/dL
KETONES UR: NEGATIVE mg/dL
NITRITE: NEGATIVE
PROTEIN: 30 mg/dL — AB
Specific Gravity, Urine: 1.025 (ref 1.005–1.030)
Urobilinogen, UA: 1 mg/dL (ref 0.0–1.0)
pH: 6.5 (ref 5.0–8.0)

## 2015-09-13 LAB — URINE MICROSCOPIC-ADD ON

## 2015-09-13 NOTE — MAU Provider Note (Signed)
History     CSN: [id number]  Arrival date and time: 09/13/15 1139   First Provider Initiated Contact with Patient 09/13/15 1326      Chief Complaint  Patient presents with  . Back Pain  . Abdominal Pain  . Nausea   HPI Comments: Mackenzie Moreno is a 39 y.o. G4P0030 at 663w3d with onset last night of feeling bloated and nauseated shortly after eating a large amount of [place] food. No vomiting. Also had mid low back pain with walking. The pain radiated to upper and lower abdomen and is not present at rest. Denies dysuria, urgency, frequency, hematuria. Denies previous similarepisodes.Did not take Labetalol today.  Back Pain This is a new problem. The current episode started yesterday. The problem occurs intermittently. The problem has been waxing and waning (No pain at present resting) since onset. Radiates to: upper and lower abdomen only when walking. The pain is at a severity of 4/10. The pain is mild. The pain is the same all the time. The symptoms are aggravated by bending and standing. Stiffness is present at night. Associated symptoms include dysuria. Pertinent negatives include no abdominal pain, fever or headaches. (Ate a lot of cake and chinese food last night. Felt bloated, then had Tums and soda and felt nauseated. Hasn't eaten today but nausea resolved. No pain now.) Risk factors include pregnancy. Treatments tried: Tums. The treatment provided no relief.   [facility].  Had borderline increased AFV on last US> next 11/11. OB History  Gravida Para Term Preterm AB SAB TAB Ectopic Multiple Living  4 0 0 0 3 2 1 0 0 1     # Outcome Date GA Lbr Len/2nd Weight Sex Delivery Anes PTL Lv  4 Current           3 SAB           2 SAB           1 TAB                Past Medical History  Diagnosis Date  . Hypertension     on aldomet  . Anemia   . Missed ab current    Past Surgical History  Procedure Laterality Date  . Wisdom tooth  extraction    . Cholecystectomy  2005  . Tonsillectomy  2007  . Dilation and evacuation  05/18/2012    Procedure: DILATATION AND EVACUATION;  Surgeon: Annamaria BootsMary Suzanne Miller, MD;  Location: [facility];  Service: Gynecology;  Laterality: N/A;  . Cholecystectomy      Family History  Problem Relation Age of Onset  . Cancer Mother   . COPD Mother   . Hypertension Mother   . Heart disease Mother   . Heart disease Father   . Hypertension Brother     Social History  Substance Use Topics  . Smoking status: Former Games developermoker  . Smokeless tobacco: Never Used  . Alcohol Use: No     Comment: social    Allergies:  Allergies  Allergen Reactions  . Vicodin [Hydrocodone-Acetaminophen] Rash    Prescriptions prior to admission  Medication Sig Dispense Refill Last Dose  . calcium carbonate (TUMS - DOSED IN MG ELEMENTAL CALCIUM) 500 MG chewable tablet Chew 1 tablet by mouth daily.   09/12/2015 at Unknown time  . Prenatal Vit-Fe Fumarate-FA (PRENATAL MULTIVITAMIN) TABS tablet Take 1 tablet by mouth daily at 12 noon.   09/12/2015 at Unknown time  . hydrocortisone cream  0.5 % Apply 1 application topically as needed for itching (rash).   prn  . labetalol (NORMODYNE) 300 MG tablet Take 1 tablet (300 mg total) by mouth 2 (two) times daily. 60 tablet 12 09/12/2015 at 9:00pm    Review of Systems  Constitutional: Negative for fever, chills and malaise/fatigue.  Respiratory: Negative for cough and shortness of breath.   Gastrointestinal: Positive for nausea. Negative for heartburn, vomiting and abdominal pain.  Genitourinary: Positive for dysuria. Negative for urgency, frequency, hematuria and flank pain.  Musculoskeletal: Positive for back pain.  Neurological: Negative for headaches.  Psychiatric/Behavioral: Negative for depression. The patient is not nervous/anxious.    Physical Exam   Blood pressure 145/78, pulse 78, temperature 98.6 F (37 C), temperature source Oral, resp. rate 16, height  (1.753  m), weight 119.296 kg (263 lb), last menstrual period 03/05/2015, unknown if currently breastfeeding.  Physical Exam  Nursing note and vitals reviewed. Constitutional: She is oriented to person, place, and time. She appears well-nourished. No distress.  HENT:  Head: Normocephalic.  Neck: Normal range of motion. Neck supple.  Cardiovascular: Normal rate and regular rhythm.   GI: She exhibits no distension. There is no tenderness. There is no rebound and no guarding.  Fundus at costal margin  Genitourinary: No vaginal discharge found.  SVE: posterior, long, closed, high  Musculoskeletal: Normal range of motion. She exhibits no edema or tenderness.  Neurological: She is alert and oriented to person, place, and time.  Skin: Skin is warm and dry.    MAU Course  Procedures Results for orders placed or performed during the hospital encounter of 09/13/15 (from the past 24 hour(s))  Urinalysis, Routine w reflex microscopic (not at [facility])     Status: Abnormal   Collection Time: 09/13/15 11:44 AM  Result Value Ref Range   Color, Urine YELLOW YELLOW   APPearance CLEAR CLEAR   Specific Gravity, Urine 1.025 1.005 - 1.030   pH 6.5 5.0 - 8.0   Glucose, UA NEGATIVE NEGATIVE mg/dL   Hgb urine dipstick TRACE (A) NEGATIVE   Bilirubin Urine NEGATIVE NEGATIVE   Ketones, ur NEGATIVE NEGATIVE mg/dL   Protein, ur 30 (A) NEGATIVE mg/dL   Urobilinogen, UA 1.0 0.0 - 1.0 mg/dL   Nitrite NEGATIVE NEGATIVE   Leukocytes, UA TRACE (A) NEGATIVE  Urine microscopic-add on     Status: Abnormal   Collection Time: 09/13/15 11:44 AM  Result Value Ref Range   Squamous Epithelial / LPF FEW (A) RARE   WBC, UA 3-6 <3 WBC/hpf   RBC / HPF 0-2 <3 RBC/hpf   Bacteria, UA MANY (A) RARE  Urine culture sent Tolerated gingerale and crackers and remained pain-free while in exam room.  MDM No evidence PTL and asymptomatic for UTI.   Assessment and Plan   1. Pain of round ligament affecting pregnancy, antepartum   2.  Indigestion   G4P0030 at [redacted]w[redacted]d CHTN AMA    Medication List    TAKE these medications        calcium carbonate 500 MG chewable tablet  Commonly known as:  TUMS - dosed in mg elemental calcium  Chew 1 tablet by mouth daily.     hydrocortisone cream 0.5 %  Apply 1 application topically as needed for itching (rash).     labetalol 300 MG tablet  Commonly known as:  NORMODYNE  Take 1 tablet (300 mg total) by mouth 2 (two) times daily.     prenatal multivitamin Tabs tablet  Take 1 tablet by  mouth daily at 12 noon.       Follow-up Information    Follow up with HARPER,CHARLES A, MD On 09/25/2015.   Specialty:  Obstetrics and Gynecology   Why:  Keep your scheduled prenatal appointment   Contact information:   [address] [place] [address] [phone]      . Marland Kitchen Advised increased fluids Micayla Brathwaite 09/13/2015, 1:27 PM

## 2015-09-13 NOTE — MAU Note (Signed)
Patient presents with upper back pain, abdominal pain and nausea since last night.

## 2015-09-13 NOTE — Discharge Instructions (Signed)

## 2015-09-14 LAB — URINE CULTURE: Special Requests: NORMAL

## 2015-09-16 ENCOUNTER — Other Ambulatory Visit: Payer: BC Managed Care – PPO

## 2015-09-16 ENCOUNTER — Encounter: Payer: BC Managed Care – PPO | Admitting: Certified Nurse Midwife

## 2015-09-18 ENCOUNTER — Encounter: Payer: BC Managed Care – PPO | Admitting: Certified Nurse Midwife

## 2015-09-18 ENCOUNTER — Other Ambulatory Visit: Payer: BC Managed Care – PPO

## 2015-09-25 ENCOUNTER — Ambulatory Visit (INDEPENDENT_AMBULATORY_CARE_PROVIDER_SITE_OTHER): Payer: BC Managed Care – PPO | Admitting: Certified Nurse Midwife

## 2015-09-25 VITALS — BP 119/77 | HR 92 | Temp 98.8°F | Wt 267.0 lb

## 2015-09-25 DIAGNOSIS — Z23 Encounter for immunization: Secondary | ICD-10-CM

## 2015-09-25 DIAGNOSIS — Z3493 Encounter for supervision of normal pregnancy, unspecified, third trimester: Secondary | ICD-10-CM

## 2015-09-25 LAB — POCT URINALYSIS DIPSTICK
Bilirubin, UA: NEGATIVE
GLUCOSE UA: NEGATIVE
Ketones, UA: NEGATIVE
LEUKOCYTES UA: NEGATIVE
NITRITE UA: NEGATIVE
PH UA: 6
Protein, UA: NEGATIVE
RBC UA: NEGATIVE
Spec Grav, UA: 1.015
UROBILINOGEN UA: NEGATIVE

## 2015-09-25 MED ORDER — TETANUS-DIPHTH-ACELL PERTUSSIS 5-2.5-18.5 LF-MCG/0.5 IM SUSP
0.5000 mL | Freq: Once | INTRAMUSCULAR | Status: AC
  Administered 2015-09-25: 0.5 mL via INTRAMUSCULAR

## 2015-09-25 NOTE — Addendum Note (Signed)
Addended by: Marya LandryFOSTER, Emmauel Hallums D on: 09/25/2015 12:12 PM   Modules accepted: Orders

## 2015-09-25 NOTE — Progress Notes (Signed)
Subjective:    Mackenzie Moreno is a 39 y.o. female being seen today for her obstetrical visit. She is at 5773w1d gestation. Patient reports no complaints. Fetal movement: normal.  Desires to work until delivery.  Discussed MFM recommendation of delivery around 38 weeks.  Patient states that she will be getting NSTs at [facility] d/t her schedule and that she can be scheduled at 7:30 before she goes to work.    Problem List Items Addressed This Visit    None    Visit Diagnoses    Prenatal care, third trimester    -  Primary    Relevant Orders    POCT urinalysis dipstick      Patient Active Problem List   Diagnosis Date Noted  . Encounter for (NT) nuchal translucency scan   . [redacted] weeks gestation of pregnancy   . High blood pressure 05/22/2015  . Supervision of high-risk pregnancy of elderly primigravida (>=37 years old at delivery) 05/22/2015   Objective:    BP 119/77 mmHg  Pulse 92  Temp(Src) 98.8 F (37.1 C)  Wt 267 lb (121.11 kg)  LMP 03/05/2015 FHT:  154 BPM  Uterine Size: unable to determine d/t maternal habitus  Presentation: cephalic     Assessment:    Pregnancy @ 3773w1d weeks   Doing well.  Influenza & DTaP given  Plan:     labs reviewed, problem list updated Consent signed. GBS planning TDAP & Flu shot given.  Rhogam given for RH negative Pediatrician: discussed. Infant feeding: plans to breastfeed. Maternity leave: discussed. Cigarette smoking: never smoked. Orders Placed This Encounter  Procedures  . POCT urinalysis dipstick   No orders of the defined types were placed in this encounter.   Follow up in 2 Weeks.

## 2015-09-25 NOTE — Progress Notes (Signed)
Patient request flu shot

## 2015-10-01 ENCOUNTER — Other Ambulatory Visit (HOSPITAL_COMMUNITY): Payer: Self-pay | Admitting: Obstetrics and Gynecology

## 2015-10-01 DIAGNOSIS — O09529 Supervision of elderly multigravida, unspecified trimester: Secondary | ICD-10-CM

## 2015-10-01 DIAGNOSIS — Z3A30 30 weeks gestation of pregnancy: Secondary | ICD-10-CM

## 2015-10-01 DIAGNOSIS — O99213 Obesity complicating pregnancy, third trimester: Secondary | ICD-10-CM

## 2015-10-01 DIAGNOSIS — O358XX0 Maternal care for other (suspected) fetal abnormality and damage, not applicable or unspecified: Secondary | ICD-10-CM

## 2015-10-01 DIAGNOSIS — O169 Unspecified maternal hypertension, unspecified trimester: Secondary | ICD-10-CM

## 2015-10-02 ENCOUNTER — Ambulatory Visit (HOSPITAL_COMMUNITY)
Admission: RE | Admit: 2015-10-02 | Discharge: 2015-10-02 | Disposition: A | Discharge status: Home / Self Care | Payer: BC Managed Care – PPO | Source: Ambulatory Visit | Attending: Certified Nurse Midwife | Admitting: Certified Nurse Midwife

## 2015-10-02 ENCOUNTER — Encounter (HOSPITAL_COMMUNITY): Payer: Self-pay

## 2015-10-02 DIAGNOSIS — O403XX0 Polyhydramnios, third trimester, not applicable or unspecified: Secondary | ICD-10-CM | POA: Diagnosis not present

## 2015-10-02 DIAGNOSIS — O358XX0 Maternal care for other (suspected) fetal abnormality and damage, not applicable or unspecified: Secondary | ICD-10-CM | POA: Diagnosis not present

## 2015-10-02 DIAGNOSIS — O09522 Supervision of elderly multigravida, second trimester: Secondary | ICD-10-CM | POA: Insufficient documentation

## 2015-10-02 DIAGNOSIS — O169 Unspecified maternal hypertension, unspecified trimester: Secondary | ICD-10-CM

## 2015-10-02 DIAGNOSIS — O283 Abnormal ultrasonic finding on antenatal screening of mother: Secondary | ICD-10-CM | POA: Diagnosis not present

## 2015-10-02 DIAGNOSIS — O10019 Pre-existing essential hypertension complicating pregnancy, unspecified trimester: Secondary | ICD-10-CM | POA: Insufficient documentation

## 2015-10-02 DIAGNOSIS — Z3A30 30 weeks gestation of pregnancy: Secondary | ICD-10-CM | POA: Insufficient documentation

## 2015-10-02 DIAGNOSIS — O99213 Obesity complicating pregnancy, third trimester: Secondary | ICD-10-CM

## 2015-10-02 DIAGNOSIS — O09529 Supervision of elderly multigravida, unspecified trimester: Secondary | ICD-10-CM

## 2015-10-07 ENCOUNTER — Other Ambulatory Visit: Payer: Self-pay | Admitting: Certified Nurse Midwife

## 2015-10-09 ENCOUNTER — Telehealth: Payer: Self-pay | Admitting: *Deleted

## 2015-10-09 ENCOUNTER — Ambulatory Visit (INDEPENDENT_AMBULATORY_CARE_PROVIDER_SITE_OTHER): Payer: BC Managed Care – PPO | Admitting: Certified Nurse Midwife

## 2015-10-09 VITALS — BP 131/83 | HR 94 | Temp 98.1°F | Wt 263.0 lb

## 2015-10-09 DIAGNOSIS — O0993 Supervision of high risk pregnancy, unspecified, third trimester: Secondary | ICD-10-CM

## 2015-10-09 LAB — POCT URINALYSIS DIPSTICK
Bilirubin, UA: NEGATIVE
Blood, UA: NEGATIVE
GLUCOSE UA: NEGATIVE
Ketones, UA: NEGATIVE
Nitrite, UA: NEGATIVE
PROTEIN UA: NEGATIVE
Spec Grav, UA: 1.015
UROBILINOGEN UA: NEGATIVE
pH, UA: 6

## 2015-10-09 NOTE — Progress Notes (Signed)
Subjective:    Mackenzie Moreno is a 39 y.o. female being seen today for her obstetrical visit. She is at 3168w1d gestation. Patient reports no bleeding, no contractions, no cramping, no leaking and URI/cold symptoms for a few days, denies any fever and has not taken any medications for it. Discussed OTC treatments and when to f/u for URI.  Patient verbalized understanding.  Desires to wait until 39 weeks for induction, if needing to be induced.  Fetal movement: normal.  Problem List Items Addressed This Visit    None     Patient Active Problem List   Diagnosis Date Noted  . Encounter for (NT) nuchal translucency scan   . [redacted] weeks gestation of pregnancy   . High blood pressure 05/22/2015  . Supervision of high-risk pregnancy of elderly primigravida (>=37 years old at delivery) 05/22/2015   Objective:    LMP 03/05/2015 FHT:  160 BPM  Uterine Size: unable to determine d/t body habitus  Presentation: cephalic     Assessment:    Pregnancy @ 3968w1d weeks   Plan:     labs reviewed, problem list updated Consent signed. GBS sent TDAP offered  Rhogam given for RH negative Pediatrician: discussed. Infant feeding: plans to breastfeed. Maternity leave: discussed. Cigarette smoking: never smoked. No orders of the defined types were placed in this encounter.   No orders of the defined types were placed in this encounter.   Follow up in 2 Weeks.

## 2015-10-09 NOTE — Telephone Encounter (Signed)
Pt called to office inquiring what she may take OTC for cold like symptoms.  Return call to pt.  LM making pt aware of OTC that are recommended during pregnancy. Advised pt to call office if symptoms persist after OTC treatment.  Advised to call office if any question regarding medications given on message.

## 2015-10-13 ENCOUNTER — Other Ambulatory Visit (HOSPITAL_COMMUNITY): Payer: Self-pay | Admitting: Obstetrics and Gynecology

## 2015-10-13 ENCOUNTER — Encounter (HOSPITAL_COMMUNITY): Payer: Self-pay

## 2015-10-13 ENCOUNTER — Ambulatory Visit (HOSPITAL_COMMUNITY)
Admission: RE | Admit: 2015-10-13 | Discharge: 2015-10-13 | Disposition: A | Discharge status: Home / Self Care | Payer: BC Managed Care – PPO | Source: Ambulatory Visit | Attending: Obstetrics and Gynecology | Admitting: Obstetrics and Gynecology

## 2015-10-13 DIAGNOSIS — O99213 Obesity complicating pregnancy, third trimester: Secondary | ICD-10-CM | POA: Insufficient documentation

## 2015-10-13 DIAGNOSIS — O403XX0 Polyhydramnios, third trimester, not applicable or unspecified: Secondary | ICD-10-CM | POA: Diagnosis not present

## 2015-10-13 DIAGNOSIS — O10913 Unspecified pre-existing hypertension complicating pregnancy, third trimester: Secondary | ICD-10-CM | POA: Diagnosis not present

## 2015-10-13 DIAGNOSIS — O358XX0 Maternal care for other (suspected) fetal abnormality and damage, not applicable or unspecified: Secondary | ICD-10-CM | POA: Insufficient documentation

## 2015-10-13 DIAGNOSIS — O09522 Supervision of elderly multigravida, second trimester: Secondary | ICD-10-CM | POA: Insufficient documentation

## 2015-10-13 DIAGNOSIS — O169 Unspecified maternal hypertension, unspecified trimester: Secondary | ICD-10-CM

## 2015-10-13 DIAGNOSIS — Z3A31 31 weeks gestation of pregnancy: Secondary | ICD-10-CM | POA: Insufficient documentation

## 2015-10-13 DIAGNOSIS — O283 Abnormal ultrasonic finding on antenatal screening of mother: Secondary | ICD-10-CM | POA: Diagnosis not present

## 2015-10-16 ENCOUNTER — Other Ambulatory Visit (HOSPITAL_COMMUNITY): Payer: BC Managed Care – PPO

## 2015-10-16 ENCOUNTER — Ambulatory Visit (HOSPITAL_COMMUNITY): Payer: BC Managed Care – PPO

## 2015-10-20 ENCOUNTER — Encounter (HOSPITAL_COMMUNITY): Payer: Self-pay

## 2015-10-20 ENCOUNTER — Ambulatory Visit (HOSPITAL_COMMUNITY)
Admission: RE | Admit: 2015-10-20 | Discharge: 2015-10-20 | Disposition: A | Discharge status: Home / Self Care | Payer: BC Managed Care – PPO | Source: Ambulatory Visit | Attending: Certified Nurse Midwife | Admitting: Certified Nurse Midwife

## 2015-10-20 DIAGNOSIS — O10913 Unspecified pre-existing hypertension complicating pregnancy, third trimester: Secondary | ICD-10-CM | POA: Diagnosis present

## 2015-10-20 DIAGNOSIS — O09523 Supervision of elderly multigravida, third trimester: Secondary | ICD-10-CM | POA: Diagnosis not present

## 2015-10-20 DIAGNOSIS — O403XX0 Polyhydramnios, third trimester, not applicable or unspecified: Secondary | ICD-10-CM | POA: Insufficient documentation

## 2015-10-20 DIAGNOSIS — Z3A31 31 weeks gestation of pregnancy: Secondary | ICD-10-CM | POA: Diagnosis not present

## 2015-10-23 ENCOUNTER — Encounter (HOSPITAL_COMMUNITY): Payer: Self-pay

## 2015-10-23 ENCOUNTER — Ambulatory Visit (HOSPITAL_COMMUNITY)
Admission: RE | Admit: 2015-10-23 | Discharge: 2015-10-23 | Disposition: A | Discharge status: Home / Self Care | Payer: BC Managed Care – PPO | Source: Ambulatory Visit | Attending: Certified Nurse Midwife | Admitting: Certified Nurse Midwife

## 2015-10-23 ENCOUNTER — Encounter: Payer: BC Managed Care – PPO | Admitting: Certified Nurse Midwife

## 2015-10-23 ENCOUNTER — Other Ambulatory Visit (HOSPITAL_COMMUNITY): Payer: Self-pay | Admitting: Obstetrics and Gynecology

## 2015-10-23 DIAGNOSIS — O09523 Supervision of elderly multigravida, third trimester: Secondary | ICD-10-CM | POA: Diagnosis not present

## 2015-10-23 DIAGNOSIS — O99213 Obesity complicating pregnancy, third trimester: Secondary | ICD-10-CM | POA: Diagnosis not present

## 2015-10-23 DIAGNOSIS — O283 Abnormal ultrasonic finding on antenatal screening of mother: Secondary | ICD-10-CM | POA: Diagnosis not present

## 2015-10-23 DIAGNOSIS — O10013 Pre-existing essential hypertension complicating pregnancy, third trimester: Secondary | ICD-10-CM | POA: Diagnosis not present

## 2015-10-23 DIAGNOSIS — O288 Other abnormal findings on antenatal screening of mother: Secondary | ICD-10-CM

## 2015-10-23 DIAGNOSIS — O403XX0 Polyhydramnios, third trimester, not applicable or unspecified: Secondary | ICD-10-CM

## 2015-10-23 DIAGNOSIS — Z3A33 33 weeks gestation of pregnancy: Secondary | ICD-10-CM

## 2015-10-23 DIAGNOSIS — O358XX0 Maternal care for other (suspected) fetal abnormality and damage, not applicable or unspecified: Secondary | ICD-10-CM | POA: Diagnosis not present

## 2015-10-23 DIAGNOSIS — O169 Unspecified maternal hypertension, unspecified trimester: Secondary | ICD-10-CM

## 2015-10-26 ENCOUNTER — Encounter: Payer: BC Managed Care – PPO | Admitting: Obstetrics

## 2015-10-26 ENCOUNTER — Ambulatory Visit (INDEPENDENT_AMBULATORY_CARE_PROVIDER_SITE_OTHER): Payer: BC Managed Care – PPO | Admitting: Certified Nurse Midwife

## 2015-10-26 VITALS — BP 128/80 | HR 95 | Temp 98.9°F | Wt 266.0 lb

## 2015-10-26 DIAGNOSIS — Z3493 Encounter for supervision of normal pregnancy, unspecified, third trimester: Secondary | ICD-10-CM

## 2015-10-26 LAB — POCT URINALYSIS DIPSTICK
Bilirubin, UA: NEGATIVE
Blood, UA: NEGATIVE
GLUCOSE UA: NEGATIVE
KETONES UA: NEGATIVE
Nitrite, UA: NEGATIVE
SPEC GRAV UA: 1.01
Urobilinogen, UA: NEGATIVE
pH, UA: 5

## 2015-10-26 NOTE — Progress Notes (Signed)
Subjective:    Mackenzie Moreno is a 39 y.o. female being seen today for her obstetrical visit. She is at 6763w4d gestation. Patient reports no complaints. Fetal movement: normal.  Problem List Items Addressed This Visit    None     Patient Active Problem List   Diagnosis Date Noted  . Encounter for (NT) nuchal translucency scan   . [redacted] weeks gestation of pregnancy   . High blood pressure 05/22/2015  . Supervision of high-risk pregnancy of elderly primigravida (>=37 years old at delivery) 05/22/2015   Objective:    LMP 03/05/2015 FHT:  145 BPM  Uterine Size: unable to determine d/t body habitus  Presentation: unsure     Assessment:    Pregnancy @ 2463w4d weeks   Plan:     labs reviewed, problem list updated Consent signed. GBS planning TDAP offered  Rhogam given for RH negative Pediatrician: discussed. Infant feeding: plans to breastfeed. Maternity leave: discussed. Cigarette smoking: never smoked. No orders of the defined types were placed in this encounter.   No orders of the defined types were placed in this encounter.   Follow up in 1 Week.

## 2015-10-26 NOTE — Progress Notes (Signed)
Patient reports she is doing well- [facility]

## 2015-10-27 ENCOUNTER — Ambulatory Visit (HOSPITAL_COMMUNITY)
Admission: RE | Admit: 2015-10-27 | Discharge: 2015-10-27 | Disposition: A | Discharge status: Home / Self Care | Payer: BC Managed Care – PPO | Source: Ambulatory Visit | Attending: Maternal and Fetal Medicine | Admitting: Maternal and Fetal Medicine

## 2015-10-27 ENCOUNTER — Ambulatory Visit (HOSPITAL_COMMUNITY)
Admission: RE | Admit: 2015-10-27 | Discharge: 2015-10-27 | Disposition: A | Discharge status: Home / Self Care | Payer: BC Managed Care – PPO | Source: Ambulatory Visit | Attending: Certified Nurse Midwife | Admitting: Certified Nurse Midwife

## 2015-10-27 ENCOUNTER — Encounter (HOSPITAL_COMMUNITY): Payer: Self-pay

## 2015-10-27 DIAGNOSIS — O09513 Supervision of elderly primigravida, third trimester: Secondary | ICD-10-CM | POA: Diagnosis not present

## 2015-10-27 DIAGNOSIS — Z3A33 33 weeks gestation of pregnancy: Secondary | ICD-10-CM | POA: Insufficient documentation

## 2015-10-27 DIAGNOSIS — O283 Abnormal ultrasonic finding on antenatal screening of mother: Secondary | ICD-10-CM | POA: Insufficient documentation

## 2015-10-27 DIAGNOSIS — O10013 Pre-existing essential hypertension complicating pregnancy, third trimester: Secondary | ICD-10-CM | POA: Diagnosis not present

## 2015-10-28 ENCOUNTER — Other Ambulatory Visit: Payer: Self-pay | Admitting: Certified Nurse Midwife

## 2015-10-30 ENCOUNTER — Ambulatory Visit (HOSPITAL_COMMUNITY)
Admission: RE | Admit: 2015-10-30 | Discharge: 2015-10-30 | Disposition: A | Discharge status: Home / Self Care | Payer: BC Managed Care – PPO | Source: Ambulatory Visit | Attending: Certified Nurse Midwife | Admitting: Certified Nurse Midwife

## 2015-10-30 ENCOUNTER — Other Ambulatory Visit: Payer: Self-pay | Admitting: Certified Nurse Midwife

## 2015-10-30 ENCOUNTER — Other Ambulatory Visit (HOSPITAL_COMMUNITY): Payer: Self-pay | Admitting: Obstetrics and Gynecology

## 2015-10-30 DIAGNOSIS — O283 Abnormal ultrasonic finding on antenatal screening of mother: Secondary | ICD-10-CM | POA: Insufficient documentation

## 2015-10-30 DIAGNOSIS — O358XX0 Maternal care for other (suspected) fetal abnormality and damage, not applicable or unspecified: Secondary | ICD-10-CM

## 2015-10-30 DIAGNOSIS — O99213 Obesity complicating pregnancy, third trimester: Secondary | ICD-10-CM

## 2015-10-30 DIAGNOSIS — O10013 Pre-existing essential hypertension complicating pregnancy, third trimester: Secondary | ICD-10-CM | POA: Diagnosis not present

## 2015-10-30 DIAGNOSIS — Z3A34 34 weeks gestation of pregnancy: Secondary | ICD-10-CM

## 2015-10-30 DIAGNOSIS — O35DXX0 Maternal care for other (suspected) fetal abnormality and damage, fetal gastrointestinal anomalies, not applicable or unspecified: Secondary | ICD-10-CM

## 2015-10-30 DIAGNOSIS — O169 Unspecified maternal hypertension, unspecified trimester: Secondary | ICD-10-CM

## 2015-10-30 DIAGNOSIS — O09513 Supervision of elderly primigravida, third trimester: Secondary | ICD-10-CM

## 2015-10-30 DIAGNOSIS — O09523 Supervision of elderly multigravida, third trimester: Secondary | ICD-10-CM | POA: Insufficient documentation

## 2015-11-03 ENCOUNTER — Ambulatory Visit (HOSPITAL_COMMUNITY)
Admission: RE | Admit: 2015-11-03 | Discharge: 2015-11-03 | Disposition: A | Discharge status: Home / Self Care | Payer: BC Managed Care – PPO | Source: Ambulatory Visit | Attending: Certified Nurse Midwife | Admitting: Certified Nurse Midwife

## 2015-11-03 ENCOUNTER — Encounter (HOSPITAL_COMMUNITY): Payer: Self-pay

## 2015-11-03 DIAGNOSIS — O10913 Unspecified pre-existing hypertension complicating pregnancy, third trimester: Secondary | ICD-10-CM | POA: Insufficient documentation

## 2015-11-03 DIAGNOSIS — O09513 Supervision of elderly primigravida, third trimester: Secondary | ICD-10-CM | POA: Insufficient documentation

## 2015-11-03 DIAGNOSIS — Z3A34 34 weeks gestation of pregnancy: Secondary | ICD-10-CM | POA: Diagnosis not present

## 2015-11-05 ENCOUNTER — Ambulatory Visit (INDEPENDENT_AMBULATORY_CARE_PROVIDER_SITE_OTHER): Payer: BC Managed Care – PPO | Admitting: Certified Nurse Midwife

## 2015-11-05 VITALS — BP 140/83 | HR 97 | Wt 269.0 lb

## 2015-11-05 DIAGNOSIS — O0993 Supervision of high risk pregnancy, unspecified, third trimester: Secondary | ICD-10-CM

## 2015-11-06 ENCOUNTER — Ambulatory Visit (HOSPITAL_COMMUNITY)
Admission: RE | Admit: 2015-11-06 | Discharge: 2015-11-06 | Disposition: A | Discharge status: Home / Self Care | Payer: BC Managed Care – PPO | Source: Ambulatory Visit | Attending: Certified Nurse Midwife | Admitting: Certified Nurse Midwife

## 2015-11-06 ENCOUNTER — Encounter (HOSPITAL_COMMUNITY): Payer: Self-pay

## 2015-11-06 DIAGNOSIS — O169 Unspecified maternal hypertension, unspecified trimester: Secondary | ICD-10-CM

## 2015-11-06 DIAGNOSIS — O358XX0 Maternal care for other (suspected) fetal abnormality and damage, not applicable or unspecified: Secondary | ICD-10-CM | POA: Insufficient documentation

## 2015-11-06 DIAGNOSIS — O283 Abnormal ultrasonic finding on antenatal screening of mother: Secondary | ICD-10-CM | POA: Insufficient documentation

## 2015-11-06 DIAGNOSIS — Z3A35 35 weeks gestation of pregnancy: Secondary | ICD-10-CM | POA: Insufficient documentation

## 2015-11-06 DIAGNOSIS — O09513 Supervision of elderly primigravida, third trimester: Secondary | ICD-10-CM | POA: Diagnosis not present

## 2015-11-06 DIAGNOSIS — O99213 Obesity complicating pregnancy, third trimester: Secondary | ICD-10-CM | POA: Insufficient documentation

## 2015-11-06 DIAGNOSIS — O09523 Supervision of elderly multigravida, third trimester: Secondary | ICD-10-CM | POA: Diagnosis not present

## 2015-11-06 DIAGNOSIS — O10013 Pre-existing essential hypertension complicating pregnancy, third trimester: Secondary | ICD-10-CM | POA: Insufficient documentation

## 2015-11-06 LAB — POCT URINALYSIS DIPSTICK
Bilirubin, UA: NEGATIVE
Blood, UA: NEGATIVE
Glucose, UA: NEGATIVE
KETONES UA: NEGATIVE
LEUKOCYTES UA: NEGATIVE
Nitrite, UA: NEGATIVE
PROTEIN UA: NEGATIVE
Spec Grav, UA: 1.015
Urobilinogen, UA: NEGATIVE
pH, UA: 6

## 2015-11-06 NOTE — Progress Notes (Signed)
Subjective:    Mackenzie Moreno is a 39 y.o. female being seen today for her obstetrical visit. She is at 7552w1d gestation. Patient reports no complaints. Fetal movement: normal.  Discussed delivery plans and timing for induction.    Problem List Items Addressed This Visit    None    Visit Diagnoses    Encounter for supervision of normal first pregnancy in third trimester    -  Primary    Relevant Orders    Strep B DNA probe    POCT urinalysis dipstick (Completed)      Patient Active Problem List   Diagnosis Date Noted  . Encounter for (NT) nuchal translucency scan   . [redacted] weeks gestation of pregnancy   . High blood pressure 05/22/2015  . Supervision of high-risk pregnancy of elderly primigravida (>=37 years old at delivery) 05/22/2015   Objective:    BP 140/83 mmHg  Pulse 97  Wt 269 lb (122.018 kg)  LMP 03/05/2015 FHT:  150 BPM  Uterine Size: unable to determine d/t maternal habitus  Presentation: cephalic   Cervix: long, thick, posterior, about 0.5 cm dilated, -3 station  Assessment:    Pregnancy @ 6452w1d weeks   Doing well  Plan:   Schedule IOL in 2 weeks.    labs reviewed, problem list updated Consent signed. GBS sent TDAP offered  Rhogam given for RH negative Pediatrician: discussed. Infant feeding: plans to breastfeed. Maternity leave: discussed. Cigarette smoking: never smoked. Orders Placed This Encounter  Procedures  . Strep B DNA probe  . POCT urinalysis dipstick   No orders of the defined types were placed in this encounter.   Follow up in 1 Week.

## 2015-11-07 LAB — STREP B DNA PROBE: GBSP: DETECTED

## 2015-11-10 ENCOUNTER — Other Ambulatory Visit (HOSPITAL_COMMUNITY): Payer: Self-pay | Admitting: Maternal and Fetal Medicine

## 2015-11-10 ENCOUNTER — Ambulatory Visit (HOSPITAL_COMMUNITY)
Admission: RE | Admit: 2015-11-10 | Discharge: 2015-11-10 | Disposition: A | Discharge status: Home / Self Care | Payer: BC Managed Care – PPO | Source: Ambulatory Visit | Attending: Maternal and Fetal Medicine | Admitting: Maternal and Fetal Medicine

## 2015-11-10 ENCOUNTER — Ambulatory Visit (HOSPITAL_COMMUNITY)
Admission: RE | Admit: 2015-11-10 | Discharge: 2015-11-10 | Disposition: A | Discharge status: Home / Self Care | Payer: BC Managed Care – PPO | Source: Ambulatory Visit | Attending: Certified Nurse Midwife | Admitting: Certified Nurse Midwife

## 2015-11-10 DIAGNOSIS — O99213 Obesity complicating pregnancy, third trimester: Secondary | ICD-10-CM | POA: Insufficient documentation

## 2015-11-10 DIAGNOSIS — O10019 Pre-existing essential hypertension complicating pregnancy, unspecified trimester: Secondary | ICD-10-CM

## 2015-11-10 DIAGNOSIS — O09523 Supervision of elderly multigravida, third trimester: Secondary | ICD-10-CM

## 2015-11-10 DIAGNOSIS — O09513 Supervision of elderly primigravida, third trimester: Secondary | ICD-10-CM | POA: Insufficient documentation

## 2015-11-10 DIAGNOSIS — O10013 Pre-existing essential hypertension complicating pregnancy, third trimester: Secondary | ICD-10-CM | POA: Insufficient documentation

## 2015-11-10 DIAGNOSIS — Z3A35 35 weeks gestation of pregnancy: Secondary | ICD-10-CM | POA: Diagnosis not present

## 2015-11-10 DIAGNOSIS — E669 Obesity, unspecified: Secondary | ICD-10-CM | POA: Insufficient documentation

## 2015-11-10 DIAGNOSIS — O10913 Unspecified pre-existing hypertension complicating pregnancy, third trimester: Secondary | ICD-10-CM

## 2015-11-11 ENCOUNTER — Other Ambulatory Visit: Payer: Self-pay | Admitting: Certified Nurse Midwife

## 2015-11-12 ENCOUNTER — Ambulatory Visit (INDEPENDENT_AMBULATORY_CARE_PROVIDER_SITE_OTHER): Payer: BC Managed Care – PPO | Admitting: Certified Nurse Midwife

## 2015-11-12 VITALS — BP 132/84 | HR 98 | Temp 98.9°F | Wt 270.8 lb

## 2015-11-12 DIAGNOSIS — Z3403 Encounter for supervision of normal first pregnancy, third trimester: Secondary | ICD-10-CM

## 2015-11-12 LAB — POCT URINALYSIS DIPSTICK
Bilirubin, UA: NEGATIVE
Glucose, UA: NEGATIVE
Ketones, UA: NEGATIVE
LEUKOCYTES UA: NEGATIVE
NITRITE UA: NEGATIVE
PH UA: 5
PROTEIN UA: NEGATIVE
RBC UA: NEGATIVE
Spec Grav, UA: 1.02
UROBILINOGEN UA: NEGATIVE

## 2015-11-12 NOTE — Progress Notes (Signed)
Pt is doing well.

## 2015-11-12 NOTE — Progress Notes (Signed)
Subjective:    Mackenzie Moreno is a 39 y.o. female being seen today for her obstetrical visit. She is at 7722w0d gestation. Patient reports backache, no bleeding, no contractions, no cramping and no leaking. Fetal movement: normal.  Problem List Items Addressed This Visit    None    Visit Diagnoses    Encounter for supervision of normal first pregnancy in third trimester    -  Primary    Relevant Orders    POCT urinalysis dipstick (Completed)      Patient Active Problem List   Diagnosis Date Noted  . Encounter for (NT) nuchal translucency scan   . [redacted] weeks gestation of pregnancy   . High blood pressure 05/22/2015  . Supervision of high-risk pregnancy of elderly primigravida (>=37 years old at delivery) 05/22/2015   Objective:    BP 132/84 mmHg  Pulse 98  Temp(Src) 98.9 F (37.2 C)  Wt 270 lb 12.8 oz (122.834 kg)  LMP 03/05/2015 FHT:  140 BPM  Uterine Size: size equals dates  Presentation: cephalic     Assessment:    Pregnancy @ 1322w0d weeks   Supervision of high risk first pregnancy d/t ama and HTN Plan:     labs reviewed, problem list updated Consent signed. GBS sent TDAP offered  Rhogam given for RH negative Pediatrician: discussed. Infant feeding: plans to breastfeed. Maternity leave: discussed. Cigarette smoking: never smoked. Orders Placed This Encounter  Procedures  . POCT urinalysis dipstick   No orders of the defined types were placed in this encounter.   Follow up in 1 Week.

## 2015-11-13 ENCOUNTER — Ambulatory Visit (HOSPITAL_COMMUNITY)
Admission: RE | Admit: 2015-11-13 | Discharge: 2015-11-13 | Disposition: A | Discharge status: Home / Self Care | Payer: BC Managed Care – PPO | Source: Ambulatory Visit | Attending: Certified Nurse Midwife | Admitting: Certified Nurse Midwife

## 2015-11-13 ENCOUNTER — Other Ambulatory Visit (HOSPITAL_COMMUNITY): Payer: BC Managed Care – PPO

## 2015-11-13 ENCOUNTER — Other Ambulatory Visit (HOSPITAL_COMMUNITY): Payer: Self-pay | Admitting: Obstetrics and Gynecology

## 2015-11-13 DIAGNOSIS — O99213 Obesity complicating pregnancy, third trimester: Secondary | ICD-10-CM

## 2015-11-13 DIAGNOSIS — O358XX0 Maternal care for other (suspected) fetal abnormality and damage, not applicable or unspecified: Secondary | ICD-10-CM | POA: Diagnosis not present

## 2015-11-13 DIAGNOSIS — O10013 Pre-existing essential hypertension complicating pregnancy, third trimester: Secondary | ICD-10-CM

## 2015-11-13 DIAGNOSIS — Z3A36 36 weeks gestation of pregnancy: Secondary | ICD-10-CM | POA: Diagnosis not present

## 2015-11-13 DIAGNOSIS — O09523 Supervision of elderly multigravida, third trimester: Secondary | ICD-10-CM

## 2015-11-13 DIAGNOSIS — O283 Abnormal ultrasonic finding on antenatal screening of mother: Secondary | ICD-10-CM | POA: Diagnosis not present

## 2015-11-17 ENCOUNTER — Other Ambulatory Visit (HOSPITAL_COMMUNITY): Payer: BC Managed Care – PPO

## 2015-11-19 ENCOUNTER — Ambulatory Visit (INDEPENDENT_AMBULATORY_CARE_PROVIDER_SITE_OTHER): Payer: BC Managed Care – PPO | Admitting: Certified Nurse Midwife

## 2015-11-19 VITALS — BP 135/87 | HR 99 | Temp 99.1°F | Wt 267.0 lb

## 2015-11-19 DIAGNOSIS — B373 Candidiasis of vulva and vagina: Secondary | ICD-10-CM

## 2015-11-19 DIAGNOSIS — O0993 Supervision of high risk pregnancy, unspecified, third trimester: Secondary | ICD-10-CM

## 2015-11-19 DIAGNOSIS — B3731 Acute candidiasis of vulva and vagina: Secondary | ICD-10-CM

## 2015-11-19 LAB — POCT URINALYSIS DIPSTICK
BILIRUBIN UA: NEGATIVE
GLUCOSE UA: NEGATIVE
KETONES UA: NEGATIVE
Leukocytes, UA: NEGATIVE
Nitrite, UA: NEGATIVE
Protein, UA: NEGATIVE
RBC UA: NEGATIVE
SPEC GRAV UA: 1.015
UROBILINOGEN UA: NEGATIVE
pH, UA: 5

## 2015-11-19 MED ORDER — FLUCONAZOLE 100 MG PO TABS: 100.0000 mg | ORAL_TABLET | Freq: Once | ORAL | Status: DC

## 2015-11-19 NOTE — Progress Notes (Signed)
Subjective:    Mackenzie Moreno is a 39 y.o. female being seen today for her obstetrical visit. She is at 6059w0d gestation. Patient reports backache, no bleeding, no cramping, no leaking and occasional contractions. Fetal movement: normal.  Problem List Items Addressed This Visit    None    Visit Diagnoses    Supervision of high risk pregnancy in third trimester    -  Primary    Relevant Orders    POCT urinalysis dipstick (Completed)    Vulvovaginal candidiasis        Relevant Medications    fluconazole (DIFLUCAN) 100 MG tablet      Patient Active Problem List   Diagnosis Date Noted  . Encounter for (NT) nuchal translucency scan   . [redacted] weeks gestation of pregnancy   . High blood pressure 05/22/2015  . Supervision of high-risk pregnancy of elderly primigravida (>=37 years old at delivery) 05/22/2015    Objective:    BP 135/87 mmHg  Pulse 99  Temp(Src) 99.1 F (37.3 C)  Wt 267 lb (121.11 kg)  LMP 03/05/2015 FHT: 125 BPM  Uterine Size: size greater than dates and difficult to assess d/t maternal body habitus  Presentations: cephalic  Pelvic Exam:              Dilation: 1cm       Effacement: Long             Station:  -3    Consistency: soft            Position: posterior     Assessment:    Pregnancy @ 6059w0d weeks   AMA  Maternal obesity  HTN  Plan:   Plans for delivery: Vaginal anticipated; labs reviewed; problem list updated  IOL scheduled for 12/03/15 per MFM recommendations d/t obesity, HTN and AMA status.  Counseling: Consent signed. Infant feeding: plans to breastfeed. Cigarette smoking: never smoked. L&D discussion: symptoms of labor, discussed when to call, discussed what number to call, anesthetic/analgesic options reviewed and delivering clinician:  plans no preference. Postpartum supports and preparation: circumcision discussed and contraception plans discussed.  Follow up in 1 Week.

## 2015-11-20 ENCOUNTER — Other Ambulatory Visit (HOSPITAL_COMMUNITY): Payer: Self-pay | Admitting: Obstetrics and Gynecology

## 2015-11-20 ENCOUNTER — Other Ambulatory Visit (HOSPITAL_COMMUNITY): Payer: BC Managed Care – PPO

## 2015-11-20 ENCOUNTER — Encounter (HOSPITAL_COMMUNITY): Payer: Self-pay

## 2015-11-20 ENCOUNTER — Ambulatory Visit (HOSPITAL_COMMUNITY)
Admission: RE | Admit: 2015-11-20 | Discharge: 2015-11-20 | Disposition: A | Discharge status: Home / Self Care | Payer: BC Managed Care – PPO | Source: Ambulatory Visit | Attending: Certified Nurse Midwife | Admitting: Certified Nurse Midwife

## 2015-11-20 DIAGNOSIS — Z3A37 37 weeks gestation of pregnancy: Secondary | ICD-10-CM

## 2015-11-20 DIAGNOSIS — O10013 Pre-existing essential hypertension complicating pregnancy, third trimester: Secondary | ICD-10-CM | POA: Insufficient documentation

## 2015-11-20 DIAGNOSIS — O99213 Obesity complicating pregnancy, third trimester: Secondary | ICD-10-CM

## 2015-11-20 DIAGNOSIS — O09523 Supervision of elderly multigravida, third trimester: Secondary | ICD-10-CM | POA: Diagnosis not present

## 2015-11-20 DIAGNOSIS — O358XX0 Maternal care for other (suspected) fetal abnormality and damage, not applicable or unspecified: Secondary | ICD-10-CM | POA: Diagnosis not present

## 2015-11-20 DIAGNOSIS — O169 Unspecified maternal hypertension, unspecified trimester: Secondary | ICD-10-CM

## 2015-11-20 DIAGNOSIS — O283 Abnormal ultrasonic finding on antenatal screening of mother: Secondary | ICD-10-CM | POA: Insufficient documentation

## 2015-11-22 NOTE — L&D Delivery Note (Signed)
Delivery Note  This is a 40 year old G 4 P0 who was admitted for IOL for AMA and chronic HTN. She progressed with cytotec, pitocin, foley bulb and epidural to the second stage of labor.  She pushed for 1 hours.  At 5:45 PM she delivered a viable infant female, cephalic, over an intact perineum.  A nuchal cord   was not identified. Infant placed on maternal abdomen.  Delayed cord clamping was performed for 10 minutes.  Cord double clamped and cut.  Apgar scores were 8 and 9. The placenta delivered spontaneously, shultz, with a 3 vessel cord.  Inspection revealed 1st degree. The uterus was firm bleeding stable.  The repair was not required.   EBL was 300.    Placenta and umbilical artery blood gas were not sent.  Placenta to pathology, umbilical cord cyts and placental cysts present.  PR Cytotec given as precaution d/t risk of hemorrhage. There were no complications during the procedure.  Mom and baby skin to skin following delivery. Left in stable condition.  Delivered via Vaginal, Spontaneous Delivery (Presentation: ; Occiput Anterior).  APGAR: 8, 9; weight  .   Placenta status: Intact, Spontaneous.  Cord: 3 vessels with the following complications: None.  Cord pH: N/A  Anesthesia: Epidural  Episiotomy: None Lacerations: 1st degree Suture Repair: none Est. Blood Loss (mL): 300  Mom to postpartum.  Baby to Couplet care / Skin to Skin.  Roe Coombsachelle A Denney, CNM 12/05/2015, 6:14 PM

## 2015-11-23 LAB — SURESWAB, VAGINOSIS/VAGINITIS PLUS
Atopobium vaginae: 6.4 Log (cells/mL)
BV CATEGORY: UNDETERMINED — AB
C. ALBICANS, DNA: NOT DETECTED
C. GLABRATA, DNA: NOT DETECTED
C. TRACHOMATIS RNA, TMA: NOT DETECTED
C. parapsilosis, DNA: NOT DETECTED
C. tropicalis, DNA: NOT DETECTED
LACTOBACILLUS SPECIES: 6 Log (cells/mL)
MEGASPHAERA SPECIES: 8 Log (cells/mL)
N. gonorrhoeae RNA, TMA: NOT DETECTED
T. vaginalis RNA, QL TMA: NOT DETECTED

## 2015-11-24 ENCOUNTER — Other Ambulatory Visit (HOSPITAL_COMMUNITY): Payer: BC Managed Care – PPO

## 2015-11-25 ENCOUNTER — Other Ambulatory Visit: Payer: Self-pay | Admitting: Certified Nurse Midwife

## 2015-11-25 DIAGNOSIS — B9689 Other specified bacterial agents as the cause of diseases classified elsewhere: Secondary | ICD-10-CM

## 2015-11-25 DIAGNOSIS — N760 Acute vaginitis: Principal | ICD-10-CM

## 2015-11-25 MED ORDER — METRONIDAZOLE 500 MG PO TABS: 500.0000 mg | ORAL_TABLET | Freq: Two times a day (BID) | ORAL | Status: DC

## 2015-11-26 ENCOUNTER — Ambulatory Visit (INDEPENDENT_AMBULATORY_CARE_PROVIDER_SITE_OTHER): Payer: BC Managed Care – PPO | Admitting: Certified Nurse Midwife

## 2015-11-26 ENCOUNTER — Encounter: Payer: BC Managed Care – PPO | Admitting: Certified Nurse Midwife

## 2015-11-26 VITALS — BP 146/98 | HR 85 | Temp 99.1°F | Wt 272.0 lb

## 2015-11-26 DIAGNOSIS — IMO0001 Reserved for inherently not codable concepts without codable children: Secondary | ICD-10-CM

## 2015-11-26 DIAGNOSIS — R030 Elevated blood-pressure reading, without diagnosis of hypertension: Secondary | ICD-10-CM

## 2015-11-26 DIAGNOSIS — O0993 Supervision of high risk pregnancy, unspecified, third trimester: Secondary | ICD-10-CM

## 2015-11-26 LAB — CBC
HEMATOCRIT: 30.6 % — AB (ref 36.0–46.0)
HEMOGLOBIN: 10.8 g/dL — AB (ref 12.0–15.0)
MCH: 31.2 pg (ref 26.0–34.0)
MCHC: 35.3 g/dL (ref 30.0–36.0)
MCV: 88.4 fL (ref 78.0–100.0)
MPV: 9.4 fL (ref 8.6–12.4)
Platelets: 253 10*3/uL (ref 150–400)
RBC: 3.46 MIL/uL — AB (ref 3.87–5.11)
RDW: 14.2 % (ref 11.5–15.5)
WBC: 6.5 10*3/uL (ref 4.0–10.5)

## 2015-11-26 LAB — ALT: ALT: 12 U/L (ref 6–29)

## 2015-11-26 LAB — POCT URINALYSIS DIPSTICK
BILIRUBIN UA: NEGATIVE
Blood, UA: NEGATIVE
GLUCOSE UA: NEGATIVE
KETONES UA: NEGATIVE
NITRITE UA: NEGATIVE
PH UA: 6
PROTEIN UA: NEGATIVE
SPEC GRAV UA: 1.015
Urobilinogen, UA: NEGATIVE

## 2015-11-26 LAB — LACTATE DEHYDROGENASE: LDH: 108 U/L (ref 94–250)

## 2015-11-26 LAB — CREATININE, SERUM: Creat: 0.53 mg/dL (ref 0.50–1.10)

## 2015-11-26 LAB — AST: AST: 14 U/L (ref 10–30)

## 2015-11-26 NOTE — Progress Notes (Signed)
Subjective:    Mackenzie Moreno is a 40 y.o. female being seen today for her obstetrical visit. She is at 7676w0d gestation. Patient reports no complaints. Fetal movement: normal.  States that she ate a lot of salt the last two days.  Scheduled with MFM for f/u US in the AM with growth.    Problem List Items Addressed This Visit    None    Visit Diagnoses    Supervision of high risk pregnancy in third trimester    -  Primary    Relevant Orders    POCT urinalysis dipstick (Completed)    Lactate dehydrogenase    Creatinine, serum    CBC    ALT    AST    Pathologist smear review    Protein / creatinine ratio, urine    Elevated blood pressure        Relevant Orders    Lactate dehydrogenase    Creatinine, serum    CBC    ALT    AST    Pathologist smear review    Protein / creatinine ratio, urine      Patient Active Problem List   Diagnosis Date Noted  . Encounter for (NT) nuchal translucency scan   . [redacted] weeks gestation of pregnancy   . High blood pressure 05/22/2015  . Supervision of high-risk pregnancy of elderly primigravida (>=38 years old at delivery) 05/22/2015    Objective:    BP 146/98 mmHg  Pulse 85  Temp(Src) 99.1 F (37.3 C)  Wt 272 lb (123.378 kg)  LMP 03/05/2015 FHT: 155 BPM  Uterine Size: unable to determine d/t maternal habitus  Presentations: cephalic  Pelvic Exam: deferred   EFW: 8# by leopold's  Assessment:    Pregnancy @ 5376w0d weeks   elevated b/p in office today, PIH labs drawn  hx of HTN on labetalol  ?LGA Plan:    IOL scheduled for 12/03/15 Plans for delivery: Vaginal anticipated; labs reviewed; problem list updated Counseling: Consent signed. Infant feeding: plans to breastfeed. Cigarette smoking: never smoked. L&D discussion: symptoms of labor, discussed when to call, discussed what number to call, anesthetic/analgesic options reviewed and delivering clinician:  plans Certified Nurse-Midwife. Postpartum supports and preparation:  circumcision discussed and contraception plans discussed.  Follow up 2 weeks postpartum.

## 2015-11-27 ENCOUNTER — Other Ambulatory Visit: Payer: Self-pay | Admitting: *Deleted

## 2015-11-27 ENCOUNTER — Other Ambulatory Visit: Payer: Self-pay | Admitting: Certified Nurse Midwife

## 2015-11-27 ENCOUNTER — Ambulatory Visit (HOSPITAL_COMMUNITY)
Admission: RE | Admit: 2015-11-27 | Discharge: 2015-11-27 | Disposition: A | Discharge status: Home / Self Care | Payer: BC Managed Care – PPO | Source: Ambulatory Visit | Attending: Certified Nurse Midwife | Admitting: Certified Nurse Midwife

## 2015-11-27 ENCOUNTER — Encounter (HOSPITAL_COMMUNITY): Payer: Self-pay

## 2015-11-27 ENCOUNTER — Other Ambulatory Visit (HOSPITAL_COMMUNITY): Payer: BC Managed Care – PPO

## 2015-11-27 DIAGNOSIS — O09523 Supervision of elderly multigravida, third trimester: Secondary | ICD-10-CM | POA: Insufficient documentation

## 2015-11-27 DIAGNOSIS — Z3A38 38 weeks gestation of pregnancy: Secondary | ICD-10-CM | POA: Diagnosis not present

## 2015-11-27 DIAGNOSIS — O283 Abnormal ultrasonic finding on antenatal screening of mother: Secondary | ICD-10-CM | POA: Insufficient documentation

## 2015-11-27 DIAGNOSIS — O10913 Unspecified pre-existing hypertension complicating pregnancy, third trimester: Secondary | ICD-10-CM

## 2015-11-27 DIAGNOSIS — O99213 Obesity complicating pregnancy, third trimester: Secondary | ICD-10-CM | POA: Diagnosis not present

## 2015-11-27 DIAGNOSIS — Z3491 Encounter for supervision of normal pregnancy, unspecified, first trimester: Secondary | ICD-10-CM

## 2015-11-27 DIAGNOSIS — B9689 Other specified bacterial agents as the cause of diseases classified elsewhere: Secondary | ICD-10-CM

## 2015-11-27 DIAGNOSIS — N760 Acute vaginitis: Principal | ICD-10-CM

## 2015-11-27 DIAGNOSIS — O358XX0 Maternal care for other (suspected) fetal abnormality and damage, not applicable or unspecified: Secondary | ICD-10-CM | POA: Insufficient documentation

## 2015-11-27 DIAGNOSIS — O10013 Pre-existing essential hypertension complicating pregnancy, third trimester: Secondary | ICD-10-CM | POA: Diagnosis present

## 2015-11-27 DIAGNOSIS — O169 Unspecified maternal hypertension, unspecified trimester: Secondary | ICD-10-CM

## 2015-11-27 LAB — PROTEIN / CREATININE RATIO, URINE
CREATININE, URINE: 115 mg/dL (ref 20–320)
PROTEIN CREATININE RATIO: 148 mg/g{creat} (ref 21–161)
Total Protein, Urine: 17 mg/dL (ref 5–24)

## 2015-11-27 LAB — PATHOLOGIST SMEAR REVIEW

## 2015-11-27 MED ORDER — METRONIDAZOLE 500 MG PO TABS: 500.0000 mg | ORAL_TABLET | Freq: Two times a day (BID) | ORAL | Status: DC

## 2015-11-27 MED ORDER — LABETALOL HCL 300 MG PO TABS: 300.0000 mg | ORAL_TABLET | Freq: Two times a day (BID) | ORAL | Status: DC

## 2015-11-27 NOTE — Progress Notes (Signed)
Flagyl and Labetalol were reordered under Dr Clearance CootsHarper due to provider not recognized by ins.

## 2015-11-30 ENCOUNTER — Ambulatory Visit (HOSPITAL_COMMUNITY): Payer: BC Managed Care – PPO

## 2015-12-01 ENCOUNTER — Telehealth (HOSPITAL_COMMUNITY): Payer: Self-pay | Admitting: *Deleted

## 2015-12-01 NOTE — Telephone Encounter (Signed)
Preadmission screen  

## 2015-12-03 ENCOUNTER — Inpatient Hospital Stay (HOSPITAL_COMMUNITY)
Admission: RE | Admit: 2015-12-03 | Discharge: 2015-12-07 | DRG: 774 | Disposition: A | Discharge status: Home / Self Care | Payer: BC Managed Care – PPO | Source: Ambulatory Visit | Attending: Obstetrics | Admitting: Obstetrics

## 2015-12-03 ENCOUNTER — Encounter: Payer: BC Managed Care – PPO | Admitting: Certified Nurse Midwife

## 2015-12-03 ENCOUNTER — Encounter (HOSPITAL_COMMUNITY): Payer: Self-pay

## 2015-12-03 DIAGNOSIS — O1092 Unspecified pre-existing hypertension complicating childbirth: Secondary | ICD-10-CM | POA: Diagnosis present

## 2015-12-03 DIAGNOSIS — Z8249 Family history of ischemic heart disease and other diseases of the circulatory system: Secondary | ICD-10-CM

## 2015-12-03 DIAGNOSIS — O403XX0 Polyhydramnios, third trimester, not applicable or unspecified: Secondary | ICD-10-CM | POA: Diagnosis present

## 2015-12-03 DIAGNOSIS — Z3A39 39 weeks gestation of pregnancy: Secondary | ICD-10-CM | POA: Diagnosis not present

## 2015-12-03 DIAGNOSIS — O09529 Supervision of elderly multigravida, unspecified trimester: Secondary | ICD-10-CM

## 2015-12-03 DIAGNOSIS — Z825 Family history of asthma and other chronic lower respiratory diseases: Secondary | ICD-10-CM

## 2015-12-03 DIAGNOSIS — IMO0002 Reserved for concepts with insufficient information to code with codable children: Secondary | ICD-10-CM | POA: Diagnosis present

## 2015-12-03 DIAGNOSIS — Z809 Family history of malignant neoplasm, unspecified: Secondary | ICD-10-CM | POA: Diagnosis not present

## 2015-12-03 DIAGNOSIS — Z87891 Personal history of nicotine dependence: Secondary | ICD-10-CM | POA: Diagnosis not present

## 2015-12-03 LAB — TYPE AND SCREEN
ABO/RH(D): O POS
Antibody Screen: NEGATIVE

## 2015-12-03 LAB — CBC
HEMATOCRIT: 32.9 % — AB (ref 36.0–46.0)
Hemoglobin: 11.2 g/dL — ABNORMAL LOW (ref 12.0–15.0)
MCH: 31.5 pg (ref 26.0–34.0)
MCHC: 34 g/dL (ref 30.0–36.0)
MCV: 92.4 fL (ref 78.0–100.0)
PLATELETS: 268 10*3/uL (ref 150–400)
RBC: 3.56 MIL/uL — AB (ref 3.87–5.11)
RDW: 14.1 % (ref 11.5–15.5)
WBC: 6.1 10*3/uL (ref 4.0–10.5)

## 2015-12-03 MED ORDER — FLEET ENEMA 7-19 GM/118ML RE ENEM: 1.0000 | ENEMA | Freq: Every day | RECTAL | Status: DC | PRN

## 2015-12-03 MED ORDER — LABETALOL HCL 300 MG PO TABS
300.0000 mg | ORAL_TABLET | Freq: Two times a day (BID) | ORAL | Status: DC
  Administered 2015-12-03 (×2): 300 mg via ORAL
  Filled 2015-12-03 (×3): qty 1

## 2015-12-03 MED ORDER — PROMETHAZINE HCL 25 MG/ML IJ SOLN: 25.0000 mg | Freq: Four times a day (QID) | INTRAMUSCULAR | Status: DC | PRN

## 2015-12-03 MED ORDER — FENTANYL CITRATE (PF) 100 MCG/2ML IJ SOLN
100.0000 ug | INTRAMUSCULAR | Status: DC | PRN
  Administered 2015-12-05: 100 ug via INTRAVENOUS
  Filled 2015-12-03: qty 2

## 2015-12-03 MED ORDER — CITRIC ACID-SODIUM CITRATE 334-500 MG/5ML PO SOLN: 30.0000 mL | ORAL | Status: DC | PRN

## 2015-12-03 MED ORDER — NALBUPHINE HCL 10 MG/ML IJ SOLN: 10.0000 mg | INTRAMUSCULAR | Status: DC | PRN

## 2015-12-03 MED ORDER — ONDANSETRON HCL 4 MG/2ML IJ SOLN
4.0000 mg | Freq: Four times a day (QID) | INTRAMUSCULAR | Status: DC | PRN
  Administered 2015-12-05: 4 mg via INTRAVENOUS
  Filled 2015-12-03: qty 2

## 2015-12-03 MED ORDER — LACTATED RINGERS IV SOLN
500.0000 mL | INTRAVENOUS | Status: DC | PRN
  Administered 2015-12-04 – 2015-12-05 (×3): 500 mL via INTRAVENOUS

## 2015-12-03 MED ORDER — MISOPROSTOL 50MCG HALF TABLET
50.0000 ug | ORAL_TABLET | ORAL | Status: DC
  Administered 2015-12-03 – 2015-12-04 (×5): 50 ug via ORAL
  Filled 2015-12-03 (×5): qty 0.5

## 2015-12-03 MED ORDER — OXYTOCIN 10 UNIT/ML IJ SOLN
2.5000 [IU]/h | INTRAVENOUS | Status: DC
  Administered 2015-12-05: 2.5 [IU]/h via INTRAVENOUS
  Filled 2015-12-03: qty 4

## 2015-12-03 MED ORDER — METRONIDAZOLE 500 MG PO TABS
500.0000 mg | ORAL_TABLET | Freq: Two times a day (BID) | ORAL | Status: DC
  Administered 2015-12-03 – 2015-12-04 (×4): 500 mg via ORAL
  Filled 2015-12-03 (×5): qty 1

## 2015-12-03 MED ORDER — LACTATED RINGERS IV SOLN
INTRAVENOUS | Status: DC
  Administered 2015-12-03 – 2015-12-05 (×7): via INTRAVENOUS

## 2015-12-03 MED ORDER — OXYTOCIN BOLUS FROM INFUSION
500.0000 mL | INTRAVENOUS | Status: DC
  Administered 2015-12-05: 500 mL via INTRAVENOUS

## 2015-12-03 MED ORDER — LIDOCAINE HCL (PF) 1 % IJ SOLN
30.0000 mL | INTRAMUSCULAR | Status: DC | PRN
  Administered 2015-12-05: 30 mL via SUBCUTANEOUS
  Filled 2015-12-03: qty 30

## 2015-12-03 MED ORDER — TERBUTALINE SULFATE 1 MG/ML IJ SOLN: 0.2500 mg | Freq: Once | INTRAMUSCULAR | Status: DC | PRN

## 2015-12-03 NOTE — H&P (Signed)
Mackenzie Moreno is a 40 y.o. female presenting for IOL d/t AMA, poly and hx of HTN. History OB History    Gravida Para Term Preterm AB TAB SAB Ectopic Multiple Living   4 0 0 0 3 1 2 0 0 0      Past Medical History  Diagnosis Date  . Hypertension     on aldomet  . Anemia   . Missed ab current   Past Surgical History  Procedure Laterality Date  . Wisdom tooth extraction    . Cholecystectomy  2005  . Tonsillectomy  2007  . Dilation and evacuation  05/18/2012    Procedure: DILATATION AND EVACUATION;  Surgeon: Annamaria BootsMary Suzanne Miller, MD;  Location: [facility];  Service: Gynecology;  Laterality: N/A;  . Cholecystectomy     Family History: family history includes COPD in her mother; Cancer in her mother; Heart disease in her father and mother; Hypertension in her brother and mother. Social History:  reports that she has quit smoking. She has never used smokeless tobacco. She reports that she does not drink alcohol or use illicit drugs.   Prenatal Transfer Tool  Maternal Diabetes: No Genetic Screening: Normal Maternal Ultrasounds/Referrals: Hx of ecogenic bowel and poly Fetal Ultrasounds or other Referrals:  Referred to Materal Fetal Medicine  Maternal Substance Abuse:  No Significant Maternal Medications:  Meds include: Other: Labetalol Significant Maternal Lab Results:  None Other Comments:  None  ROS  Dilation: Closed Effacement (%): Thick Exam by:: Mackenzie Moreno, CNM Blood pressure 151/84, pulse 88, temperature 98.3 F (36.8 C), temperature source Oral, resp. rate 18, height 5\' 9"  (1.753 m), weight 270 lb (122.471 kg), last menstrual period 03/05/2015, SpO2 98 %, unknown if currently breastfeeding. Exam Physical Exam  Prenatal labs: ABO, Rh: O/POS/-- (07/01 1534) Antibody: NEG (07/01 1534) Rubella: 7.21 (07/01 1534) RPR: NON REAC (10/19 1456)  HBsAg: NEGATIVE (07/01 1534)  HIV: NONREACTIVE (10/19 1456)  GBS: Detected (12/15 1654)   Assessment/Plan: Admit for IOL per MFM  recommendations.   AMA.  Hx of HTN stable on labetalol.   Cytotec PO. Once active Pitocin.  Possible Foley bulb.  Patient desires IVP pain medications, if needed epidural at a later date.    Roe Coombsachelle A Kenshawn Maciolek, CNM 12/03/2015, 8:56 AM

## 2015-12-04 LAB — RPR: RPR: NONREACTIVE

## 2015-12-04 LAB — HIV ANTIBODY (ROUTINE TESTING W REFLEX): HIV Screen 4th Generation wRfx: NONREACTIVE

## 2015-12-04 MED ORDER — LABETALOL HCL 5 MG/ML IV SOLN
10.0000 mg | INTRAVENOUS | Status: DC | PRN
  Administered 2015-12-05 (×4): 10 mg via INTRAVENOUS
  Filled 2015-12-04 (×3): qty 4

## 2015-12-04 MED ORDER — TERBUTALINE SULFATE 1 MG/ML IJ SOLN: 0.2500 mg | Freq: Once | INTRAMUSCULAR | Status: DC | PRN

## 2015-12-04 MED ORDER — OXYTOCIN 10 UNIT/ML IJ SOLN
1.0000 m[IU]/min | INTRAMUSCULAR | Status: DC
  Administered 2015-12-04: 2 m[IU]/min via INTRAVENOUS
  Filled 2015-12-04: qty 4

## 2015-12-04 MED ORDER — PENICILLIN G POTASSIUM 5000000 UNITS IJ SOLR
5.0000 10*6.[IU] | Freq: Once | INTRAVENOUS | Status: AC
  Administered 2015-12-04: 5 10*6.[IU] via INTRAVENOUS
  Filled 2015-12-04: qty 5

## 2015-12-04 MED ORDER — LABETALOL HCL 300 MG PO TABS
300.0000 mg | ORAL_TABLET | Freq: Three times a day (TID) | ORAL | Status: DC
  Administered 2015-12-04 – 2015-12-05 (×5): 300 mg via ORAL
  Filled 2015-12-04 (×7): qty 1

## 2015-12-04 MED ORDER — PENICILLIN G POTASSIUM 5000000 UNITS IJ SOLR
2.5000 10*6.[IU] | INTRAVENOUS | Status: DC
  Administered 2015-12-04 – 2015-12-05 (×5): 2.5 10*6.[IU] via INTRAVENOUS
  Filled 2015-12-04 (×8): qty 2.5

## 2015-12-04 NOTE — Progress Notes (Signed)
Mackenzie Moreno is a 40 y.o. G4P0030 at 2131w1d by ultrasound admitted for induction of labor due to Hypertension and AMA.  Subjective: No complaints.  Objective: BP 162/86 mmHg  Pulse 77  Temp(Src) 98.1 F (36.7 C) (Oral)  Resp 18  Ht 5\' 9"  (1.753 m)  Wt 270 lb (122.471 kg)  BMI 39.85 kg/m2  SpO2 98%  LMP 03/05/2015  ?LGA: EFW on US 8#1oz.  Poly on US.     FHT:  FHR: 150 bpm, variability: moderate,  accelerations:  Present,  decelerations:  Absent UC:   irregular, every 5-6 minutes SVE:   Dilation: 1.5 Effacement (%): 70 Station: -3 Exam by:: Mackenzie Moreno, Mackenzie Moreno, CNM  Labs: Lab Results  Component Value Date   WBC 6.1 12/03/2015   HGB 11.2* 12/03/2015   HCT 32.9* 12/03/2015   MCV 92.4 12/03/2015   PLT 268 12/03/2015    Assessment / Plan: IOL, start pitocin.  Foley bulb.   Labor: Progressing normally Preeclampsia:  no signs or symptoms of toxicity Fetal Wellbeing:  Category I Pain Control:  Labor support without medications and pain medications upon patient request I/D:  n/a Anticipated MOD:  NSVD  Roe CoombsRachelle A Badr Piedra, CNM 12/04/2015, 9:00 AM

## 2015-12-05 ENCOUNTER — Encounter (HOSPITAL_COMMUNITY): Payer: Self-pay

## 2015-12-05 ENCOUNTER — Inpatient Hospital Stay (HOSPITAL_COMMUNITY): Payer: BC Managed Care – PPO | Admitting: Anesthesiology

## 2015-12-05 LAB — CBC
HEMATOCRIT: 33.7 % — AB (ref 36.0–46.0)
HEMOGLOBIN: 11.3 g/dL — AB (ref 12.0–15.0)
MCH: 31 pg (ref 26.0–34.0)
MCHC: 33.5 g/dL (ref 30.0–36.0)
MCV: 92.6 fL (ref 78.0–100.0)
Platelets: 252 10*3/uL (ref 150–400)
RBC: 3.64 MIL/uL — ABNORMAL LOW (ref 3.87–5.11)
RDW: 13.9 % (ref 11.5–15.5)
WBC: 8.8 10*3/uL (ref 4.0–10.5)

## 2015-12-05 MED ORDER — TETANUS-DIPHTH-ACELL PERTUSSIS 5-2.5-18.5 LF-MCG/0.5 IM SUSP: 0.5000 mL | Freq: Once | INTRAMUSCULAR | Status: DC

## 2015-12-05 MED ORDER — LANOLIN HYDROUS EX OINT: TOPICAL_OINTMENT | CUTANEOUS | Status: DC | PRN

## 2015-12-05 MED ORDER — EPHEDRINE 5 MG/ML INJ: 10.0000 mg | INTRAVENOUS | Status: DC | PRN

## 2015-12-05 MED ORDER — FERROUS SULFATE 325 (65 FE) MG PO TABS
325.0000 mg | ORAL_TABLET | Freq: Two times a day (BID) | ORAL | Status: DC
  Administered 2015-12-06 – 2015-12-07 (×3): 325 mg via ORAL
  Filled 2015-12-05 (×3): qty 1

## 2015-12-05 MED ORDER — IBUPROFEN 600 MG PO TABS
600.0000 mg | ORAL_TABLET | Freq: Four times a day (QID) | ORAL | Status: DC
  Administered 2015-12-06 – 2015-12-07 (×6): 600 mg via ORAL
  Filled 2015-12-05 (×6): qty 1

## 2015-12-05 MED ORDER — METHYLERGONOVINE MALEATE 0.2 MG/ML IJ SOLN: 0.2000 mg | INTRAMUSCULAR | Status: DC | PRN

## 2015-12-05 MED ORDER — HYDROCORTISONE 1 % EX CREA
1.0000 "application " | TOPICAL_CREAM | Freq: Four times a day (QID) | CUTANEOUS | Status: DC | PRN
  Filled 2015-12-05: qty 28

## 2015-12-05 MED ORDER — MEPERIDINE HCL 50 MG PO TABS: 50.0000 mg | ORAL_TABLET | ORAL | Status: DC | PRN

## 2015-12-05 MED ORDER — PROMETHAZINE HCL 25 MG/ML IJ SOLN
12.5000 mg | Freq: Four times a day (QID) | INTRAMUSCULAR | Status: DC | PRN
  Filled 2015-12-05: qty 1

## 2015-12-05 MED ORDER — WITCH HAZEL-GLYCERIN EX PADS: 1.0000 "application " | MEDICATED_PAD | CUTANEOUS | Status: DC | PRN

## 2015-12-05 MED ORDER — METHYLERGONOVINE MALEATE 0.2 MG PO TABS: 0.2000 mg | ORAL_TABLET | ORAL | Status: DC | PRN

## 2015-12-05 MED ORDER — MISOPROSTOL 200 MCG PO TABS
1000.0000 ug | ORAL_TABLET | Freq: Once | ORAL | Status: AC
  Administered 2015-12-05: 1000 ug via RECTAL

## 2015-12-05 MED ORDER — ZOLPIDEM TARTRATE 5 MG PO TABS: 5.0000 mg | ORAL_TABLET | Freq: Every evening | ORAL | Status: DC | PRN

## 2015-12-05 MED ORDER — FLEET ENEMA 7-19 GM/118ML RE ENEM: 1.0000 | ENEMA | Freq: Every day | RECTAL | Status: DC | PRN

## 2015-12-05 MED ORDER — MEDROXYPROGESTERONE ACETATE 150 MG/ML IM SUSP: 150.0000 mg | INTRAMUSCULAR | Status: DC | PRN

## 2015-12-05 MED ORDER — PHENYLEPHRINE 40 MCG/ML (10ML) SYRINGE FOR IV PUSH (FOR BLOOD PRESSURE SUPPORT)
80.0000 ug | PREFILLED_SYRINGE | INTRAVENOUS | Status: DC | PRN
  Filled 2015-12-05: qty 20

## 2015-12-05 MED ORDER — BENZOCAINE-MENTHOL 20-0.5 % EX AERO
1.0000 "application " | INHALATION_SPRAY | CUTANEOUS | Status: DC | PRN
  Administered 2015-12-06: 1 via TOPICAL
  Filled 2015-12-05: qty 56

## 2015-12-05 MED ORDER — LOPERAMIDE HCL 2 MG PO CAPS
2.0000 mg | ORAL_CAPSULE | ORAL | Status: DC | PRN
  Administered 2015-12-05: 2 mg via ORAL
  Filled 2015-12-05 (×4): qty 1

## 2015-12-05 MED ORDER — SIMETHICONE 80 MG PO CHEW: 80.0000 mg | CHEWABLE_TABLET | ORAL | Status: DC | PRN

## 2015-12-05 MED ORDER — LABETALOL HCL 100 MG PO TABS
300.0000 mg | ORAL_TABLET | Freq: Three times a day (TID) | ORAL | Status: DC
  Administered 2015-12-05 – 2015-12-07 (×5): 300 mg via ORAL
  Filled 2015-12-05 (×5): qty 1

## 2015-12-05 MED ORDER — CALCIUM CARBONATE ANTACID 500 MG PO CHEW: 2.0000 | CHEWABLE_TABLET | Freq: Every day | ORAL | Status: DC | PRN

## 2015-12-05 MED ORDER — SENNOSIDES-DOCUSATE SODIUM 8.6-50 MG PO TABS: 2.0000 | ORAL_TABLET | ORAL | Status: DC

## 2015-12-05 MED ORDER — BISACODYL 10 MG RE SUPP: 10.0000 mg | Freq: Every day | RECTAL | Status: DC | PRN

## 2015-12-05 MED ORDER — ONDANSETRON HCL 4 MG/2ML IJ SOLN: 4.0000 mg | INTRAMUSCULAR | Status: DC | PRN

## 2015-12-05 MED ORDER — DIPHENHYDRAMINE HCL 50 MG/ML IJ SOLN
12.5000 mg | INTRAMUSCULAR | Status: DC | PRN
Start: 2015-12-05 — End: 2015-12-05

## 2015-12-05 MED ORDER — ONDANSETRON HCL 4 MG PO TABS: 4.0000 mg | ORAL_TABLET | ORAL | Status: DC | PRN

## 2015-12-05 MED ORDER — PRENATAL MULTIVITAMIN CH
1.0000 | ORAL_TABLET | Freq: Every day | ORAL | Status: DC
  Administered 2015-12-06: 1 via ORAL
  Filled 2015-12-05: qty 1

## 2015-12-05 MED ORDER — DIPHENHYDRAMINE HCL 25 MG PO CAPS: 25.0000 mg | ORAL_CAPSULE | Freq: Four times a day (QID) | ORAL | Status: DC | PRN

## 2015-12-05 MED ORDER — FENTANYL 2.5 MCG/ML BUPIVACAINE 1/10 % EPIDURAL INFUSION (WH - ANES)
14.0000 mL/h | INTRAMUSCULAR | Status: DC | PRN
  Administered 2015-12-05: 14 mL/h via EPIDURAL
  Filled 2015-12-05 (×2): qty 125

## 2015-12-05 MED ORDER — DIBUCAINE 1 % RE OINT: 1.0000 "application " | TOPICAL_OINTMENT | RECTAL | Status: DC | PRN

## 2015-12-05 MED ORDER — PANTOPRAZOLE SODIUM 40 MG PO TBEC
40.0000 mg | DELAYED_RELEASE_TABLET | Freq: Two times a day (BID) | ORAL | Status: DC
  Administered 2015-12-05: 40 mg via ORAL
  Filled 2015-12-05: qty 1

## 2015-12-05 MED ORDER — LIDOCAINE HCL (PF) 1 % IJ SOLN
INTRAMUSCULAR | Status: DC | PRN
  Administered 2015-12-05 (×2): 4 mL

## 2015-12-05 MED ORDER — OXYTOCIN 10 UNIT/ML IJ SOLN
2.5000 [IU]/h | INTRAVENOUS | Status: DC | PRN
  Administered 2015-12-06: 2.5 [IU]/h via INTRAVENOUS
  Filled 2015-12-05: qty 4

## 2015-12-05 MED ORDER — MISOPROSTOL 200 MCG PO TABS
ORAL_TABLET | ORAL | Status: AC
  Filled 2015-12-05: qty 5

## 2015-12-05 MED ORDER — MEASLES, MUMPS & RUBELLA VAC ~~LOC~~ INJ
0.5000 mL | INJECTION | Freq: Once | SUBCUTANEOUS | Status: DC
Start: 2015-12-06 — End: 2015-12-07
  Filled 2015-12-05: qty 0.5

## 2015-12-05 NOTE — Lactation Note (Signed)
This note was copied from the chart of Mackenzie Moreno [place]. Lactation Consultation Note  Patient Name: Mackenzie Moreno Fader [id number]'OToday's Date: 12/05/2015 Reason for consult: Initial assessment Baby at 3 hr of life. Baby is having a hard time maintaining latch. She has a wide open mouth, extends her tongue over gum ridge, lifts tongue to roof, has good lateralization, normal palate, and a rhythmic suck. Mom has everted nipples that go flat when compressed. Baby is doing a lot of lip licking and thump sucking. Mom was able to manually express and colostrum was noted bilaterally. Mom may be a candidate  for a NS if the baby continues to loose suction. Discussed baby behavior, feeding frequency, baby belly size, voids, wt loss, breast changes, and nipple care. Given lactation handouts and a spoon. Aware of OP services and support group.    Maternal Data Has patient been taught Hand Expression?: Yes Does the patient have breastfeeding experience prior to this delivery?: No  Feeding Feeding Type: Breast Fed Length of feed: 10 min  LATCH Score/Interventions Latch: Repeated attempts needed to sustain latch, nipple held in mouth throughout feeding, stimulation needed to elicit sucking reflex. Intervention(s): Adjust position;Breast compression;Assist with latch  Audible Swallowing: Spontaneous and intermittent  Type of Nipple: Everted at rest and after stimulation  Comfort (Breast/Nipple): Soft / non-tender     Hold (Positioning): Assistance needed to correctly position infant at breast and maintain latch. Intervention(s): Support Pillows;Position options  LATCH Score: 8  Lactation Tools Discussed/Used WIC Program: Yes   Consult Status Consult Status: Follow-up Date: 12/06/15 Follow-up type: In-patient    Rulon Eisenmengerlizabeth E Brynlyn Dade 12/05/2015, 9:10 PM

## 2015-12-05 NOTE — Anesthesia Preprocedure Evaluation (Signed)
Anesthesia Evaluation  Patient identified by MRN, date of birth, ID band Patient awake    Reviewed: Allergy & Precautions, NPO status , Patient's Chart, lab work & pertinent test results  History of Anesthesia Complications Negative for: history of anesthetic complications  Airway Mallampati: III  TM Distance: >3 FB Neck ROM: Full    Dental no notable dental hx. (+) Dental Advisory Given   Pulmonary former smoker,    Pulmonary exam normal breath sounds clear to auscultation       Cardiovascular hypertension (cHTN), Pt. on medications and Pt. on home beta blockers Normal cardiovascular exam Rhythm:Regular Rate:Normal     Neuro/Psych negative neurological ROS  negative psych ROS   GI/Hepatic negative GI ROS, Neg liver ROS,   Endo/Other  Morbid obesity  Renal/GU negative Renal ROS  negative genitourinary   Musculoskeletal negative musculoskeletal ROS (+)   Abdominal   Peds negative pediatric ROS (+)  Hematology negative hematology ROS (+)   Anesthesia Other Findings   Reproductive/Obstetrics (+) Pregnancy                             Anesthesia Physical Anesthesia Plan  ASA: III  Anesthesia Plan: Epidural   Post-op Pain Management:    Induction:   Airway Management Planned:   Additional Equipment:   Intra-op Plan:   Post-operative Plan:   Informed Consent: I have reviewed the patients History and Physical, chart, labs and discussed the procedure including the risks, benefits and alternatives for the proposed anesthesia with the patient or authorized representative who has indicated his/her understanding and acceptance.   Dental advisory given  Plan Discussed with: CRNA  Anesthesia Plan Comments:         Anesthesia Quick Evaluation

## 2015-12-05 NOTE — Anesthesia Procedure Notes (Signed)
Epidural Patient location during procedure: OB  Staffing Anesthesiologist: Jhovani Griswold Performed by: anesthesiologist   Preanesthetic Checklist Completed: patient identified, site marked, surgical consent, pre-op evaluation, timeout performed, IV checked, risks and benefits discussed and monitors and equipment checked  Epidural Patient position: sitting Prep: site prepped and draped and DuraPrep Patient monitoring: continuous pulse ox and blood pressure Approach: midline Location: L3-L4 Injection technique: LOR saline  Needle:  Needle type: Tuohy  Needle gauge: 17 G Needle length: 9 cm and 9 Needle insertion depth: 6 cm Catheter type: closed end flexible Catheter size: 19 Gauge Catheter at skin depth: 10 cm Test dose: negative  Assessment Events: blood not aspirated, injection not painful, no injection resistance, negative IV test and no paresthesia  Additional Notes Patient identified. Risks/Benefits/Options discussed with patient including but not limited to bleeding, infection, nerve damage, paralysis, failed block, incomplete pain control, headache, blood pressure changes, nausea, vomiting, reactions to medication both or allergic, itching and postpartum back pain. Confirmed with bedside nurse the patient's most recent platelet count. Confirmed with patient that they are not currently taking any anticoagulation, have any bleeding history or any family history of bleeding disorders. Patient expressed understanding and wished to proceed. All questions were answered. Sterile technique was used throughout the entire procedure. Please see nursing notes for vital signs. Test dose was given through epidural catheter and negative prior to continuing to dose epidural or start infusion. Warning signs of high block given to the patient including shortness of breath, tingling/numbness in hands, complete motor block, or any concerning symptoms with instructions to call for help. Patient was  given instructions on fall risk and not to get out of bed. All questions and concerns addressed with instructions to call with any issues or inadequate analgesia.    

## 2015-12-06 LAB — CBC
HEMATOCRIT: 30.2 % — AB (ref 36.0–46.0)
HEMOGLOBIN: 10 g/dL — AB (ref 12.0–15.0)
MCH: 30.6 pg (ref 26.0–34.0)
MCHC: 33.1 g/dL (ref 30.0–36.0)
MCV: 92.4 fL (ref 78.0–100.0)
Platelets: 220 10*3/uL (ref 150–400)
RBC: 3.27 MIL/uL — AB (ref 3.87–5.11)
RDW: 13.9 % (ref 11.5–15.5)
WBC: 13.3 10*3/uL — AB (ref 4.0–10.5)

## 2015-12-06 NOTE — Anesthesia Postprocedure Evaluation (Signed)
Anesthesia Post Note  Patient: Mackenzie Moreno  Procedure(s) Performed: * No procedures listed *  Patient location during evaluation: Mother Baby Anesthesia Type: Epidural Level of consciousness: awake and alert, oriented and patient cooperative Pain management: pain level controlled Vital Signs Assessment: post-procedure vital signs reviewed and stable Respiratory status: spontaneous breathing Cardiovascular status: stable Postop Assessment: no headache, epidural receding, patient able to bend at knees and no signs of nausea or vomiting Anesthetic complications: no    Last Vitals:  Filed Vitals:   12/06/15 0045 12/06/15 0549  BP: 140/79 150/88  Pulse: 90 82  Temp: 37.3 C 36.8 C  Resp: 18 18    Last Pain:  Filed Vitals:   12/06/15 0628  PainSc: 0-No pain                 Merrilyn PumaWRINKLE,Fionnuala Hemmerich

## 2015-12-06 NOTE — Lactation Note (Signed)
This note was copied from the chart of Mackenzie Canary BrimMonica Carlyon. Lactation Consultation Note  Patient Name: Mackenzie Moreno [id number]'s Date: 12/06/2015 Reason for consult: Follow-up assessment Baby at 22 hr of life and mom reports that baby is staying on the breast better. She denies breast or nipple pain. Her L nipple did look compressed when baby came off, encouraged mom to get more of the nipple in the baby's mouth. She was able to re latched her unassisted with a deeper latch. Visitors started to arrive and mom asked if LC could come back later. Briefly touched on mom should be feeding baby 8+/24hr, voids, wt loss, breast changes, and nipple care. Mom is aware of OP services and support group. LC left phone number so that mom could call later if she needed help.     Maternal Data    Feeding Feeding Type: Breast Fed Length of feed: 10 min  LATCH Score/Interventions Latch: Grasps breast easily, tongue down, lips flanged, rhythmical sucking. Intervention(s): Adjust position;Assist with latch;Breast massage;Breast compression  Audible Swallowing: Spontaneous and intermittent Intervention(s): Hand expression;Skin to skin  Type of Nipple: Everted at rest and after stimulation  Comfort (Breast/Nipple): Soft / non-tender     Hold (Positioning): No assistance needed to correctly position infant at breast.  LATCH Score: 10  Lactation Tools Discussed/Used     Consult Status Consult Status: Follow-up Date: 12/07/15 Follow-up type: In-patient    [name] 12/06/2015, 4:42 PM

## 2015-12-07 MED ORDER — IBUPROFEN 600 MG PO TABS: 600.0000 mg | ORAL_TABLET | Freq: Four times a day (QID) | ORAL | Status: DC | PRN

## 2015-12-07 NOTE — Progress Notes (Signed)
Post Partum Day 2 Subjective: no complaints  Objective: Blood pressure 145/91, pulse 79, temperature 98.9 F (37.2 C), temperature source Oral, resp. rate 18, height 5\' 9"  (1.753 m), weight 270 lb (122.471 kg), last menstrual period 03/05/2015, SpO2 99 %, unknown if currently breastfeeding.  Physical Exam:  General: alert and no distress Lochia: appropriate Uterine Fundus: firm Incision: none DVT Evaluation: No evidence of DVT seen on physical exam.   Recent Labs  12/05/15 0828 12/06/15 0540  HGB 11.3* 10.0*  HCT 33.7* 30.2*    Assessment/Plan: Discharge home   LOS: 4 days   HARPER,CHARLES A 12/07/2015, 7:07 AM

## 2015-12-07 NOTE — Lactation Note (Signed)
This note was copied from the chart of Mackenzie Canary BrimMonica Moller. Lactation Consultation Note: mom request LC visit before DC because she is not sure things are going well. Baby cluster fed through the night. Did give formula once through the night because baby would not settle down. Baby latched well with minimal assist from me. Encouraged mom to keep baby close to breast- she is letting her slide to tip of nipple some. Encouraged to take baby off and relatch if she feels pain or if just on the nipple. Praise given for mom's efforts. Reviewed milk supply increasing about 3 rd day. Encouraged to feed whenever she sees feeding cues,, Has visit with Ped on Wednesday. Reviewed [facility] and OP appointments as resources for support after DC. To call prn  Patient Name: Mackenzie Moreno Reason for consult: Follow-up assessment   Maternal Data Formula Feeding for Exclusion: No Has patient been taught Hand Expression?: Yes Does the patient have breastfeeding experience prior to this delivery?: No  Feeding Feeding Type: Breast Fed Length of feed: 15 min  LATCH Score/Interventions Latch: Grasps breast easily, tongue down, lips flanged, rhythmical sucking.  Audible Swallowing: A few with stimulation Intervention(s): Skin to skin  Type of Nipple: Everted at rest and after stimulation  Comfort (Breast/Nipple): Filling, red/small blisters or bruises, mild/mod discomfort  Problem noted: Mild/Moderate discomfort Interventions (Mild/moderate discomfort): Hand expression  Hold (Positioning): Assistance needed to correctly position infant at breast and maintain latch. Intervention(s): Breastfeeding basics reviewed  LATCH Score: 7  Lactation Tools Discussed/Used WIC Program: No   Consult Status Consult Status: Complete    Pamelia HoitWeeks, [name], 10:56 AM

## 2015-12-07 NOTE — Discharge Summary (Signed)
Obstetric Discharge Summary Reason for Admission: induction of labor Prenatal Procedures: NST and ultrasound Intrapartum Procedures: spontaneous vaginal delivery Postpartum Procedures: none Complications-Operative and Postpartum: none HEMOGLOBIN  Date Value Ref Range Status  12/06/2015 10.0* 12.0 - 15.0 g/dL Final   HCT  Date Value Ref Range Status  12/06/2015 30.2* 36.0 - 46.0 % Final    Physical Exam:  General: alert and no distress Lochia: appropriate Uterine Fundus: firm Incision: none DVT Evaluation: No evidence of DVT seen on physical exam.  Discharge Diagnoses: Term Pregnancy-delivered and AMA and Chronic Hypertension  Discharge Information: Date: 12/07/2015 Activity: pelvic rest Diet: routine Medications: PNV's, Iron, Ibuprofen, Colace Condition: stable Instructions: refer to practice specific booklet Discharge to: home Follow-up Information    Follow up with Roe Coombsachelle A Denney, CNM. Schedule an appointment as soon as possible for a visit in 2 weeks.   Specialty:  Certified Nurse Midwife   Contact information:   [address] [place] [id number] [phone]       Follow up with Roe Coombsachelle A Denney, CNM. Schedule an appointment as soon as possible for a visit in 2 weeks.   Specialty:  Certified Nurse Midwife   Contact information:   [address] [place] [id number] [phone]       Newborn Data: Live born female  Birth Weight: 8 lb 13.6 oz (4014 g) APGAR: 8, 9  Home with mother.  Jasmain Ahlberg A 12/07/2015, 7:31 AM

## 2015-12-08 ENCOUNTER — Other Ambulatory Visit: Payer: Self-pay | Admitting: Certified Nurse Midwife

## 2015-12-08 DIAGNOSIS — O43109 Malformation of placenta, unspecified, unspecified trimester: Secondary | ICD-10-CM

## 2015-12-10 ENCOUNTER — Encounter: Payer: BC Managed Care – PPO | Admitting: Certified Nurse Midwife

## 2015-12-11 ENCOUNTER — Encounter (HOSPITAL_COMMUNITY): Payer: Self-pay | Admitting: *Deleted

## 2015-12-11 ENCOUNTER — Inpatient Hospital Stay (HOSPITAL_COMMUNITY)
Admission: AD | Admit: 2015-12-11 | Discharge: 2015-12-14 | DRG: 776 | Disposition: A | Discharge status: Home / Self Care | Payer: BC Managed Care – PPO | Source: Ambulatory Visit | Attending: Obstetrics | Admitting: Obstetrics

## 2015-12-11 DIAGNOSIS — O133 Gestational [pregnancy-induced] hypertension without significant proteinuria, third trimester: Secondary | ICD-10-CM | POA: Diagnosis not present

## 2015-12-11 DIAGNOSIS — O135 Gestational [pregnancy-induced] hypertension without significant proteinuria, complicating the puerperium: Secondary | ICD-10-CM | POA: Diagnosis not present

## 2015-12-11 DIAGNOSIS — O164 Unspecified maternal hypertension, complicating childbirth: Secondary | ICD-10-CM

## 2015-12-11 DIAGNOSIS — Z87891 Personal history of nicotine dependence: Secondary | ICD-10-CM

## 2015-12-11 DIAGNOSIS — R030 Elevated blood-pressure reading, without diagnosis of hypertension: Secondary | ICD-10-CM | POA: Diagnosis present

## 2015-12-11 LAB — CBC WITH DIFFERENTIAL/PLATELET
BASOS ABS: 0 10*3/uL (ref 0.0–0.1)
Basophils Relative: 1 %
EOS PCT: 3 %
Eosinophils Absolute: 0.2 10*3/uL (ref 0.0–0.7)
HCT: 31.4 % — ABNORMAL LOW (ref 36.0–46.0)
HEMOGLOBIN: 10.4 g/dL — AB (ref 12.0–15.0)
LYMPHS PCT: 42 %
Lymphs Abs: 2.4 10*3/uL (ref 0.7–4.0)
MCH: 30.8 pg (ref 26.0–34.0)
MCHC: 33.1 g/dL (ref 30.0–36.0)
MCV: 92.9 fL (ref 78.0–100.0)
Monocytes Absolute: 0.6 10*3/uL (ref 0.1–1.0)
Monocytes Relative: 11 %
NEUTROS PCT: 43 %
Neutro Abs: 2.6 10*3/uL (ref 1.7–7.7)
PLATELETS: 327 10*3/uL (ref 150–400)
RBC: 3.38 MIL/uL — AB (ref 3.87–5.11)
RDW: 13.8 % (ref 11.5–15.5)
WBC: 5.8 10*3/uL (ref 4.0–10.5)

## 2015-12-11 LAB — URIC ACID: Uric Acid, Serum: 5.5 mg/dL (ref 2.3–6.6)

## 2015-12-11 LAB — COMPREHENSIVE METABOLIC PANEL
ALK PHOS: 98 U/L (ref 38–126)
ALT: 36 U/L (ref 14–54)
AST: 29 U/L (ref 15–41)
Albumin: 3.7 g/dL (ref 3.5–5.0)
Anion gap: 6 (ref 5–15)
BUN: 11 mg/dL (ref 6–20)
CHLORIDE: 106 mmol/L (ref 101–111)
CO2: 29 mmol/L (ref 22–32)
CREATININE: 0.73 mg/dL (ref 0.44–1.00)
Calcium: 8.8 mg/dL — ABNORMAL LOW (ref 8.9–10.3)
GFR calc Af Amer: >60 mL/min (ref >60)
Glucose, Bld: 88 mg/dL (ref 65–99)
Potassium: 4.2 mmol/L (ref 3.5–5.1)
SODIUM: 141 mmol/L (ref 135–145)
TOTAL PROTEIN: 6.9 g/dL (ref 6.5–8.1)
Total Bilirubin: 0.8 mg/dL (ref 0.3–1.2)

## 2015-12-11 LAB — URINALYSIS, ROUTINE W REFLEX MICROSCOPIC
Bilirubin Urine: NEGATIVE
Glucose, UA: NEGATIVE mg/dL
Ketones, ur: NEGATIVE mg/dL
NITRITE: NEGATIVE
PH: 6 (ref 5.0–8.0)
Protein, ur: 30 mg/dL — AB
SPECIFIC GRAVITY, URINE: 1.025 (ref 1.005–1.030)

## 2015-12-11 LAB — URINE MICROSCOPIC-ADD ON

## 2015-12-11 LAB — TYPE AND SCREEN
ABO/RH(D): O POS
ANTIBODY SCREEN: NEGATIVE

## 2015-12-11 LAB — LACTATE DEHYDROGENASE: LDH: 179 U/L (ref 98–192)

## 2015-12-11 LAB — PROTEIN / CREATININE RATIO, URINE
Creatinine, Urine: 191 mg/dL
PROTEIN CREATININE RATIO: 0.17 mg/mg{creat} — AB (ref 0.00–0.15)
Total Protein, Urine: 32 mg/dL

## 2015-12-11 MED ORDER — PRENATAL MULTIVITAMIN CH
1.0000 | ORAL_TABLET | Freq: Every day | ORAL | Status: DC
Start: 2015-12-11 — End: 2015-12-14
  Administered 2015-12-12 – 2015-12-13 (×2): 1 via ORAL
  Filled 2015-12-11 (×4): qty 1

## 2015-12-11 MED ORDER — SODIUM CHLORIDE 0.9 % IV SOLN
INTRAVENOUS | Status: DC
  Administered 2015-12-11 – 2015-12-12 (×3): via INTRAVENOUS

## 2015-12-11 MED ORDER — ZOLPIDEM TARTRATE 5 MG PO TABS: 5.0000 mg | ORAL_TABLET | Freq: Every evening | ORAL | Status: DC | PRN

## 2015-12-11 MED ORDER — AMLODIPINE BESYLATE 10 MG PO TABS
10.0000 mg | ORAL_TABLET | Freq: Every day | ORAL | Status: DC
  Administered 2015-12-12 – 2015-12-14 (×3): 10 mg via ORAL
  Filled 2015-12-11 (×3): qty 1

## 2015-12-11 MED ORDER — DOCUSATE SODIUM 100 MG PO CAPS
100.0000 mg | ORAL_CAPSULE | Freq: Every day | ORAL | Status: DC
  Administered 2015-12-13 – 2015-12-14 (×2): 100 mg via ORAL
  Filled 2015-12-11 (×4): qty 1

## 2015-12-11 MED ORDER — HYDRALAZINE HCL 20 MG/ML IJ SOLN
5.0000 mg | INTRAMUSCULAR | Status: AC | PRN
  Administered 2015-12-11 (×2): 10 mg via INTRAVENOUS
  Filled 2015-12-11 (×2): qty 1

## 2015-12-11 MED ORDER — CALCIUM CARBONATE ANTACID 500 MG PO CHEW
2.0000 | CHEWABLE_TABLET | ORAL | Status: DC | PRN
  Filled 2015-12-11: qty 2

## 2015-12-11 MED ORDER — MAGNESIUM SULFATE 50 % IJ SOLN
2.0000 g/h | INTRAMUSCULAR | Status: DC
Start: 2015-12-11 — End: 2015-12-13
  Administered 2015-12-11: 2 g/h via INTRAVENOUS
  Filled 2015-12-11 (×2): qty 80

## 2015-12-11 MED ORDER — MAGNESIUM SULFATE BOLUS VIA INFUSION
4.0000 g | Freq: Once | INTRAVENOUS | Status: AC
  Administered 2015-12-11: 4 g via INTRAVENOUS
  Filled 2015-12-11: qty 500

## 2015-12-11 MED ORDER — HYDROMORPHONE HCL 2 MG PO TABS
4.0000 mg | ORAL_TABLET | ORAL | Status: DC | PRN
Start: 2015-12-11 — End: 2015-12-14

## 2015-12-11 MED ORDER — IBUPROFEN 600 MG PO TABS: 600.0000 mg | ORAL_TABLET | Freq: Four times a day (QID) | ORAL | Status: DC | PRN

## 2015-12-11 MED ORDER — LABETALOL HCL 5 MG/ML IV SOLN
20.0000 mg | INTRAVENOUS | Status: AC | PRN
  Administered 2015-12-11: 40 mg via INTRAVENOUS
  Administered 2015-12-11: 20 mg via INTRAVENOUS
  Filled 2015-12-11: qty 8
  Filled 2015-12-11: qty 4

## 2015-12-11 MED ORDER — LABETALOL HCL 300 MG PO TABS
300.0000 mg | ORAL_TABLET | Freq: Three times a day (TID) | ORAL | Status: DC
Start: 2015-12-11 — End: 2015-12-13
  Administered 2015-12-11 – 2015-12-13 (×5): 300 mg via ORAL
  Filled 2015-12-11 (×5): qty 1

## 2015-12-11 NOTE — MAU Note (Signed)
Pt presents to MAU for an increase in blood pressure. Blood pressure was taken this morning by home health nurse. Reports slight headache

## 2015-12-11 NOTE — MAU Note (Signed)
Home health nurse checked pt's BP at home and notified her OB that it was elevated; OB sent pt in for further eval; hx of chronic hypertension; taking labetalol at present;

## 2015-12-11 NOTE — H&P (Signed)
 @  Mackenzie Moreno is an 40 y.o. female. Presents six days postpartum with HA and elevated BP's.  Visited by home health nurse today and BP's were elevated > 150/100 and the patient c/o HA.  Denied visual changes.   No LMP recorded.    Past Medical History  Diagnosis Date  . Hypertension   . Anemia   . Missed ab     Past Surgical History  Procedure Laterality Date  . Wisdom tooth extraction    . Cholecystectomy  2005  . Tonsillectomy  2007  . Dilation and evacuation  05/18/2012    Procedure: DILATATION AND EVACUATION;  Surgeon: Annamaria Boots, MD;  Location: [facility];  Service: Gynecology;  Laterality: N/A;  . Cholecystectomy      Family History  Problem Relation Age of Onset  . Cancer Mother   . COPD Mother   . Hypertension Mother   . Heart disease Mother   . Heart disease Father   . Hypertension Brother     Social History:  reports that she has quit smoking. She has never used smokeless tobacco. She reports that she does not drink alcohol or use illicit drugs.  Allergies:  Allergies  Allergen Reactions  . Vicodin [Hydrocodone-Acetaminophen] Rash    Prescriptions prior to admission  Medication Sig Dispense Refill Last Dose  . ibuprofen (ADVIL,MOTRIN) 600 MG tablet Take 1 tablet (600 mg total) by mouth every 6 (six) hours as needed for mild pain. 30 tablet 5 12/11/2015 at Unknown time  . labetalol (NORMODYNE) 300 MG tablet Take 1 tablet (300 mg total) by mouth 2 (two) times daily. 60 tablet 12 12/11/2015 at 1000  . Prenatal Vit-Fe Fumarate-FA (PRENATAL MULTIVITAMIN) TABS tablet Take 1 tablet by mouth daily at 12 noon.   12/11/2015 at Unknown time  . calcium carbonate (TUMS - DOSED IN MG ELEMENTAL CALCIUM) 500 MG chewable tablet Chew 2 tablets by mouth daily as needed for indigestion or heartburn.    prn  . hydrocortisone cream 0.5 % Apply 1 application topically daily as needed for itching (rash).    prn    REVIEW OF SYSTEMS: A ROS was performed and pertinent  positives and negatives are included in the history.  GENERAL: No fevers or chills. HEENT: No change in vision, no earache, sore throat or sinus congestion. NECK: No pain or stiffness. CARDIOVASCULAR: No chest pain or pressure. No palpitations. PULMONARY: No shortness of breath, cough or wheeze. GASTROINTESTINAL: No abdominal pain, nausea, vomiting or diarrhea, melena or bright red blood per rectum. GENITOURINARY: No urinary frequency, urgency, hesitancy or dysuria. MUSCULOSKELETAL: No joint or muscle pain, no back pain, no recent trauma. DERMATOLOGIC: No rash, no itching, no lesions. ENDOCRINE: No polyuria, polydipsia, no heat or cold intolerance. No recent change in weight. HEMATOLOGICAL: No anemia or easy bruising or bleeding. NEUROLOGIC: Positive for headache.  Negative for visual changes, seizures, numbness, tingling or weakness. PSYCHIATRIC: No depression, no loss of interest in normal activity or change in sleep pattern.     Blood pressure 179/101, pulse 81, temperature 98.9 F (37.2 C), resp. rate 18, unknown if currently breastfeeding.  Physical Exam:  HEENT:unremarkable Neck:Supple, midline, no thyroid megaly, no carotid bruits Lungs:  Clear to auscultation no rhonchi's or wheezes Heart:Regular rate and rhythm, no murmurs or gallops Breast Exam: negative masses, tenderness, nipple discharge Abdomen:  soft, nontender Pelvic:BUS:  not examined Vagina:  not examined Cervix:  not examined Uterus:  not examined Adnexa:  not examined Extremities: No cords, no edema  Rectal:  not done  Results for orders placed or performed during the hospital encounter of 12/11/15 (from the past 24 hour(s))  CBC with Differential/Platelet     Status: Abnormal   Collection Time: 12/11/15  1:51 PM  Result Value Ref Range   WBC 5.8 4.0 - 10.5 K/uL   RBC 3.38 (L) 3.87 - 5.11 MIL/uL   Hemoglobin 10.4 (L) 12.0 - 15.0 g/dL   HCT 40.9 (L) 81.1 - 91.4 %   MCV 92.9 78.0 - 100.0 fL   MCH 30.8 26.0 - 34.0 pg    MCHC 33.1 30.0 - 36.0 g/dL   RDW 78.2 95.6 - 21.3 %   Platelets 327 150 - 400 K/uL   Neutrophils Relative % 43 %   Neutro Abs 2.6 1.7 - 7.7 K/uL   Lymphocytes Relative 42 %   Lymphs Abs 2.4 0.7 - 4.0 K/uL   Monocytes Relative 11 %   Monocytes Absolute 0.6 0.1 - 1.0 K/uL   Eosinophils Relative 3 %   Eosinophils Absolute 0.2 0.0 - 0.7 K/uL   Basophils Relative 1 %   Basophils Absolute 0.0 0.0 - 0.1 K/uL  Comprehensive metabolic panel     Status: Abnormal   Collection Time: 12/11/15  1:51 PM  Result Value Ref Range   Sodium 141 135 - 145 mmol/L   Potassium 4.2 3.5 - 5.1 mmol/L   Chloride 106 101 - 111 mmol/L   CO2 29 22 - 32 mmol/L   Glucose, Bld 88 65 - 99 mg/dL   BUN 11 6 - 20 mg/dL   Creatinine, Ser 0.86 0.44 - 1.00 mg/dL   Calcium 8.8 (L) 8.9 - 10.3 mg/dL   Total Protein 6.9 6.5 - 8.1 g/dL   Albumin 3.7 3.5 - 5.0 g/dL   AST 29 15 - 41 U/L   ALT 36 14 - 54 U/L   Alkaline Phosphatase 98 38 - 126 U/L   Total Bilirubin 0.8 0.3 - 1.2 mg/dL   GFR calc non Af Amer >60 >60 mL/min   GFR calc Af Amer >60 >60 mL/min   Anion gap 6 5 - 15  Uric acid     Status: None   Collection Time: 12/11/15  1:51 PM  Result Value Ref Range   Uric Acid, Serum 5.5 2.3 - 6.6 mg/dL  Lactate dehydrogenase     Status: None   Collection Time: 12/11/15  1:51 PM  Result Value Ref Range   LDH 179 98 - 192 U/L  Protein / creatinine ratio, urine     Status: Abnormal   Collection Time: 12/11/15  2:00 PM  Result Value Ref Range   Creatinine, Urine 191.00 mg/dL   Total Protein, Urine 32 mg/dL   Protein Creatinine Ratio 0.17 (H) 0.00 - 0.15 mg/mg[Cre]  Urinalysis, Routine w reflex microscopic (not at [facility])     Status: Abnormal   Collection Time: 12/11/15  2:00 PM  Result Value Ref Range   Color, Urine AMBER (A) YELLOW   APPearance CLEAR CLEAR   Specific Gravity, Urine 1.025 1.005 - 1.030   pH 6.0 5.0 - 8.0   Glucose, UA NEGATIVE NEGATIVE mg/dL   Hgb urine dipstick LARGE (A) NEGATIVE   Bilirubin  Urine NEGATIVE NEGATIVE   Ketones, ur NEGATIVE NEGATIVE mg/dL   Protein, ur 30 (A) NEGATIVE mg/dL   Nitrite NEGATIVE NEGATIVE   Leukocytes, UA SMALL (A) NEGATIVE  Urine microscopic-add on     Status: Abnormal   Collection Time: 12/11/15  2:00 PM  Result Value Ref Range   Squamous Epithelial / LPF 0-5 (A) NONE SEEN   WBC, UA 0-5 0 - 5 WBC/hpf   RBC / HPF TOO NUMEROUS TO COUNT 0 - 5 RBC/hpf   Bacteria, UA FEW (A) NONE SEEN    No results found.  Assessment/Plan: Postpartum PIH.  Admit.  Magnesium sulfate.  Continue Labetalol.  HARPER,CHARLES A 12/11/2015, 3:23 PM

## 2015-12-11 NOTE — Progress Notes (Signed)
   12/11/15 2046  Vitals  Temp 97.5 F (36.4 C)  Temp Source Oral  BP (!) 172/104 mmHg  Pulse Rate 85  Hydralazine 10 mg IV administered as ordered. We will continue to monitor.

## 2015-12-11 NOTE — MAU Provider Note (Signed)
History     CSN: [id number]  Arrival date and time: 12/11/15 1329   None     Chief Complaint  Patient presents with  . Hypertension   HPI Mackenzie Moreno is a 40 y.o. [phone] who presents to MAU today with complaint of HTN. The patient states that she has CHTN and was discharged home on Labetalol 300 mg BID. She had a home health nurse visit this morning and was told her blood pressure was very high. Last Labetalol taken around 10 am today. She states mild headache for the last few days. She has taken Motrin without change, last dose around 10 am today. She also states slight blurred vision. She denies floaters or abdominal pain. She has had mild LE edema bilaterally.   OB History    Gravida Para Term Preterm AB TAB SAB Ectopic Multiple Living   0 0 0 1      Past Medical History  Diagnosis Date  . Hypertension   . Anemia   . Missed ab     Past Surgical History  Procedure Laterality Date  . Wisdom tooth extraction    . Cholecystectomy  2005  . Tonsillectomy  2007  . Dilation and evacuation  05/18/2012    Procedure: DILATATION AND EVACUATION;  Surgeon: Annamaria Boots, MD;  Location: [facility];  Service: Gynecology;  Laterality: N/A;  . Cholecystectomy      Family History  Problem Relation Age of Onset  . Cancer Mother   . COPD Mother   . Hypertension Mother   . Heart disease Mother   . Heart disease Father   . Hypertension Brother     Social History  Substance Use Topics  . Smoking status: Former Games developer  . Smokeless tobacco: Never Used  . Alcohol Use: No     Comment: social    Allergies:  Allergies  Allergen Reactions  . Vicodin [Hydrocodone-Acetaminophen] Rash    Prescriptions prior to admission  Medication Sig Dispense Refill Last Dose  . calcium carbonate (TUMS - DOSED IN MG ELEMENTAL CALCIUM) 500 MG chewable tablet Chew 2 tablets by mouth daily as needed for indigestion or heartburn.    Past Week at Unknown time  . hydrocortisone  cream 0.5 % Apply 1 application topically daily as needed for itching (rash).    Past Week at Unknown time  . ibuprofen (ADVIL,MOTRIN) 600 MG tablet Take 1 tablet (600 mg total) by mouth every 6 (six) hours as needed for mild pain. 30 tablet 5   . labetalol (NORMODYNE) 300 MG tablet Take 1 tablet (300 mg total) by mouth 2 (two) times daily. 60 tablet 12 12/02/2015 at 2200  . Prenatal Vit-Fe Fumarate-FA (PRENATAL MULTIVITAMIN) TABS tablet Take 1 tablet by mouth daily at 12 noon.   12/02/2015 at Unknown time    Review of Systems  Constitutional: Negative for fever and malaise/fatigue.  Eyes: Positive for blurred vision.       Neg - floaters  Gastrointestinal: Negative for abdominal pain.  Neurological: Positive for headaches.   Physical Exam   Blood pressure 179/101, pulse 81, temperature 98.9 F (37.2 C), resp. rate 18, unknown if currently breastfeeding.  Physical Exam  Nursing note and vitals reviewed. Constitutional: She is oriented to person, place, and time. She appears well-developed and well-nourished. No distress.  HENT:  Head: Normocephalic and atraumatic.  Cardiovascular: Normal rate.   Respiratory: Effort normal.  GI: Soft. She exhibits no distension and no mass.  There is no tenderness. There is no rebound and no guarding.  Musculoskeletal: She exhibits edema (1 + pitting edema bilaterally).  Neurological: She is alert and oriented to person, place, and time. She has normal reflexes.  No clonus  Skin: Skin is warm and dry. No erythema.  Psychiatric: She has a normal mood and affect.    Results for orders placed or performed during the hospital encounter of 12/11/15 (from the past 24 hour(s))  CBC with Differential/Platelet     Status: Abnormal   Collection Time: 12/11/15  1:51 PM  Result Value Ref Range   WBC 5.8 4.0 - 10.5 K/uL   RBC 3.38 (L) 3.87 - 5.11 MIL/uL   Hemoglobin 10.4 (L) 12.0 - 15.0 g/dL   HCT 16.1 (L) 09.6 - 04.5 %   MCV 92.9 78.0 - 100.0 fL   MCH 30.8  26.0 - 34.0 pg   MCHC 33.1 30.0 - 36.0 g/dL   RDW 40.9 81.1 - 91.4 %   Platelets 327 150 - 400 K/uL   Neutrophils Relative % 43 %   Neutro Abs 2.6 1.7 - 7.7 K/uL   Lymphocytes Relative 42 %   Lymphs Abs 2.4 0.7 - 4.0 K/uL   Monocytes Relative 11 %   Monocytes Absolute 0.6 0.1 - 1.0 K/uL   Eosinophils Relative 3 %   Eosinophils Absolute 0.2 0.0 - 0.7 K/uL   Basophils Relative 1 %   Basophils Absolute 0.0 0.0 - 0.1 K/uL  Comprehensive metabolic panel     Status: Abnormal   Collection Time: 12/11/15  1:51 PM  Result Value Ref Range   Sodium 141 135 - 145 mmol/L   Potassium 4.2 3.5 - 5.1 mmol/L   Chloride 106 101 - 111 mmol/L   CO2 29 22 - 32 mmol/L   Glucose, Bld 88 65 - 99 mg/dL   BUN 11 6 - 20 mg/dL   Creatinine, Ser 7.82 0.44 - 1.00 mg/dL   Calcium 8.8 (L) 8.9 - 10.3 mg/dL   Total Protein 6.9 6.5 - 8.1 g/dL   Albumin 3.7 3.5 - 5.0 g/dL   AST 29 15 - 41 U/L   ALT 36 14 - 54 U/L   Alkaline Phosphatase 98 38 - 126 U/L   Total Bilirubin 0.8 0.3 - 1.2 mg/dL   GFR calc non Af Amer >60 >60 mL/min   GFR calc Af Amer >60 >60 mL/min   Anion gap 6 5 - 15  Uric acid     Status: None   Collection Time: 12/11/15  1:51 PM  Result Value Ref Range   Uric Acid, Serum 5.5 2.3 - 6.6 mg/dL  Lactate dehydrogenase     Status: None   Collection Time: 12/11/15  1:51 PM  Result Value Ref Range   LDH 179 98 - 192 U/L  Protein / creatinine ratio, urine     Status: Abnormal   Collection Time: 12/11/15  2:00 PM  Result Value Ref Range   Creatinine, Urine 191.00 mg/dL   Total Protein, Urine 32 mg/dL   Protein Creatinine Ratio 0.17 (H) 0.00 - 0.15 mg/mg[Cre]  Urinalysis, Routine w reflex microscopic (not at [facility])     Status: Abnormal   Collection Time: 12/11/15  2:00 PM  Result Value Ref Range   Color, Urine AMBER (A) YELLOW   APPearance CLEAR CLEAR   Specific Gravity, Urine 1.025 1.005 - 1.030   pH 6.0 5.0 - 8.0   Glucose, UA NEGATIVE NEGATIVE mg/dL   Hgb urine dipstick  LARGE (A) NEGATIVE    Bilirubin Urine NEGATIVE NEGATIVE   Ketones, ur NEGATIVE NEGATIVE mg/dL   Protein, ur 30 (A) NEGATIVE mg/dL   Nitrite NEGATIVE NEGATIVE   Leukocytes, UA SMALL (A) NEGATIVE  Urine microscopic-add on     Status: Abnormal   Collection Time: 12/11/15  2:00 PM  Result Value Ref Range   Squamous Epithelial / LPF 0-5 (A) NONE SEEN   WBC, UA 0-5 0 - 5 WBC/hpf   RBC / HPF TOO NUMEROUS TO COUNT 0 - 5 RBC/hpf   Bacteria, UA FEW (A) NONE SEEN   Patient Vitals for the past 24 hrs:  BP Temp Pulse Resp  12/11/15 1428 (!) 179/101 mmHg - 81 18  12/11/15 1401 171/98 mmHg 98.9 F (37.2 C) 80 18    MAU Course  Procedures None  MDM UA, CBC, CMP, Uric Acid, LDH, Urine protein/creatinine ratio today Serial BPs Discussed patient, labs, and VS with Dr. Clearance Coots. He recommends admission for Mag.  Assessment and Plan  A: PPD #6 HTN, delivered  P: Admit for Mag, continue Labetalol TID, otherwise routine orders per Dr. Sharol Given, PA-C  12/11/2015, 2:46 PM

## 2015-12-12 MED ORDER — TRIAMTERENE-HCTZ 37.5-25 MG PO TABS
1.0000 | ORAL_TABLET | Freq: Every day | ORAL | Status: DC
  Administered 2015-12-12 – 2015-12-14 (×3): 1 via ORAL
  Filled 2015-12-12 (×3): qty 1

## 2015-12-12 MED ORDER — DIPHENHYDRAMINE HCL 50 MG/ML IJ SOLN
25.0000 mg | Freq: Once | INTRAMUSCULAR | Status: AC
  Administered 2015-12-12: 25 mg via INTRAVENOUS
  Filled 2015-12-12: qty 1

## 2015-12-12 MED ORDER — DEXAMETHASONE SODIUM PHOSPHATE 10 MG/ML IJ SOLN
10.0000 mg | Freq: Once | INTRAMUSCULAR | Status: AC
  Administered 2015-12-12: 10 mg via INTRAVENOUS
  Filled 2015-12-12: qty 1

## 2015-12-12 MED ORDER — METOCLOPRAMIDE HCL 5 MG/ML IJ SOLN
10.0000 mg | Freq: Once | INTRAMUSCULAR | Status: AC
  Administered 2015-12-12: 10 mg via INTRAVENOUS
  Filled 2015-12-12: qty 2

## 2015-12-12 MED ORDER — MORPHINE SULFATE (PF) 10 MG/ML IV SOLN: 10.0000 mg | Freq: Once | INTRAVENOUS | Status: DC

## 2015-12-12 NOTE — Lactation Note (Signed)
Lactation Consultation Note  Patient Name: Mackenzie Moreno [id number] Date: 12/12/2015   Initial consult with mom of 7 day old infant, mom readmitted for increased BP. Mom reports she is pumping 4 x a day and gets about 2 oz per pumping. Mom reports she is mainly pumping as infant gets frustrated with latching. Advised mom to try giving her a little EBM before BF and then try to latch infant once initial hunger is satisfied. Mom reports infant takes 2-3 oz every 2-4 hours. Mom reports she would like to increase her supply, Discussed supply and demand and enc mom to increase pumping to every 2-3 hours for 15 minutes. Mom has a DEBP set up at bedside and also has a PIS at bedside she is using.  She has no further questions and is aware she can call for questions/concerns prn.     Maternal Data    Feeding    LATCH Score/Interventions                      Lactation Tools Discussed/Used     Consult Status      Ed Blalock 12/12/2015, 4:45 PM

## 2015-12-12 NOTE — Progress Notes (Signed)
Post Partum Day 7 Subjective: no complaints  Objective: Blood pressure 158/84, pulse 94, temperature 97.5 F (36.4 C), temperature source Oral, resp. rate 18, height  (1.753 m), weight 242 lb (109.77 kg), SpO2 98 %, unknown if currently breastfeeding.  Physical Exam:  General: alert and no distress Lochia: appropriate Uterine Fundus: firm Incision: none DVT Evaluation: No evidence of DVT seen on physical exam.     Recent Labs  12/11/15 1351  HGB 10.4*  HCT 31.4*    Assessment/Plan: Postpartum Hypertension.  Stable on magnesium sulfate.  Continue current management.   LOS: 1 day   HARPER,CHARLES A 12/12/2015, 6:56 AM

## 2015-12-13 MED ORDER — CARVEDILOL 25 MG PO TABS
25.0000 mg | ORAL_TABLET | Freq: Two times a day (BID) | ORAL | Status: DC
  Administered 2015-12-13 – 2015-12-14 (×3): 25 mg via ORAL
  Filled 2015-12-13 (×3): qty 1

## 2015-12-13 NOTE — Progress Notes (Signed)
Post Partum Day 8 Subjective: no complaints  Objective: Blood pressure 159/99, pulse 85, temperature 98.3 F (36.8 C), temperature source Oral, resp. rate 18, height  (1.753 m), weight 242 lb (109.77 kg), SpO2 98 %, unknown if currently breastfeeding.  Physical Exam:  General: alert and no distress Lochia: appropriate Uterine Fundus: firm Incision: none DVT Evaluation: No evidence of DVT seen on physical exam.   Recent Labs  12/11/15 1351  HGB 10.4*  HCT 31.4*    Assessment/Plan: BP still elevated.  D/C Labetalol.  Coreg Rx.    LOS: 2 days   Kylia Grajales A 12/13/2015, 6:34 AM

## 2015-12-14 MED ORDER — TRIAMTERENE-HCTZ 37.5-25 MG PO TABS: 1.0000 | ORAL_TABLET | Freq: Every day | ORAL | Status: DC

## 2015-12-14 MED ORDER — CARVEDILOL 25 MG PO TABS: 25.0000 mg | ORAL_TABLET | Freq: Two times a day (BID) | ORAL | Status: DC

## 2015-12-14 MED ORDER — AMLODIPINE BESYLATE 10 MG PO TABS: 10.0000 mg | ORAL_TABLET | Freq: Every day | ORAL | Status: DC

## 2015-12-14 NOTE — Discharge Summary (Signed)
Physician Discharge Summary  Patient ID: Mackenzie Moreno MRN: [id number] DOB/AGE: [birth year] 39 y.o.  Admit date: 12/11/2015   Discharge date: 12/14/2015  Admission Diagnoses:  Postpartum Hypertension   Discharge Diagnoses: Same Active Problems:   Hypertension affecting pregnancy, delivered, current hospitalization   Discharged Condition: good  Hospital Course: Admitted with elevated BP's postpartum.  Responded well to therapy.  Discharged home in good condition.  Consults: None  Significant Diagnostic Studies: labs: CBC.  CMET.  Treatments: IV hydration and Antihypertensives.  Discharge Exam: Blood pressure 144/82, pulse 96, temperature 98.6 F (37 C), temperature source Oral, resp. rate 16, height  (1.753 m), weight 242 lb (109.77 kg), SpO2 98 %, unknown if currently breastfeeding. General appearance: alert and no distress Resp: clear to auscultation bilaterally Cardio: regular rate and rhythm, S1, S2 normal, no murmur, click, rub or gallop GI: soft, non-tender; bowel sounds normal; no masses,  no organomegaly Extremities: extremities normal, atraumatic, no cyanosis or edema  Disposition: 01-Home or Self Care  Discharge Instructions    Diet - low sodium heart healthy    Complete by:  As directed             Medication List    STOP taking these medications        labetalol 300 MG tablet  Commonly known as:  NORMODYNE      TAKE these medications        amLODipine 10 MG tablet  Commonly known as:  NORVASC  Take 1 tablet (10 mg total) by mouth daily.     calcium carbonate 500 MG chewable tablet  Commonly known as:  TUMS - dosed in mg elemental calcium  Chew 2 tablets by mouth daily as needed for indigestion or heartburn.     carvedilol 25 MG tablet  Commonly known as:  COREG  Take 1 tablet (25 mg total) by mouth 2 (two) times daily with a meal.     hydrocortisone cream 0.5 %  Apply 1 application topically daily as needed for itching (rash).     ibuprofen 600 MG tablet  Commonly known as:  ADVIL,MOTRIN  Take 1 tablet (600 mg total) by mouth every 6 (six) hours as needed for mild pain.     prenatal multivitamin Tabs tablet  Take 1 tablet by mouth daily at 12 noon.     triamterene-hydrochlorothiazide 37.5-25 MG tablet  Commonly known as:  MAXZIDE-25  Take 1 tablet by mouth daily.           Follow-up Information    Follow up with Roe Coombs, CNM In 1 week.   Specialty:  Certified Nurse Midwife   Contact information:   [address] [place] [address] [phone]       Signed: HARPER,CHARLES A 12/14/2015, 10:24 AM

## 2015-12-14 NOTE — Progress Notes (Signed)
Post Partum Day 9 Subjective: no complaints  Objective: Blood pressure 144/82, pulse 96, temperature 98.6 F (37 C), temperature source Oral, resp. rate 16, height  (1.753 m), weight 242 lb (109.77 kg), SpO2 98 %, unknown if currently breastfeeding.  Physical Exam:  General: alert and no distress Lochia: appropriate Uterine Fundus: firm Incision: none DVT Evaluation: No evidence of DVT seen on physical exam.   Recent Labs  12/11/15 1351  HGB 10.4*  HCT 31.4*    Assessment/Plan: Postpartum PIH.  Stable. Discharge home   LOS: 3 days   Kyreese Chio A 12/14/2015, 10:17 AM

## 2015-12-14 NOTE — OB Triage Note (Signed)
Pt given discharge instructions, information regarding medications regimen and pick, and advised about follow up appointments. Pt had no questions and voiced no concerns at this time. Pt will be walked out by nursing staff.

## 2015-12-14 NOTE — Discharge Instructions (Signed)
Hypertension Hypertension, commonly called high blood pressure, is when the force of blood pumping through your arteries is too strong. Your arteries are the blood vessels that carry blood from your heart throughout your body. A blood pressure reading consists of a higher number over a lower number, such as 110/72. The higher number (systolic) is the pressure inside your arteries when your heart pumps. The lower number (diastolic) is the pressure inside your arteries when your heart relaxes. Ideally you want your blood pressure below 120/80. Hypertension forces your heart to work harder to pump blood. Your arteries may become narrow or stiff. Having untreated or uncontrolled hypertension can cause heart attack, stroke, kidney disease, and other problems. RISK FACTORS Some risk factors for high blood pressure are controllable. Others are not.  Risk factors you cannot control include:   Race. You may be at higher risk if you are African American.  Age. Risk increases with age.  Gender. Men are at higher risk than women before age 45 years. After age 65, women are at higher risk than men. Risk factors you can control include:  Not getting enough exercise or physical activity.  Being overweight.  Getting too much fat, sugar, calories, or salt in your diet.  Drinking too much alcohol. SIGNS AND SYMPTOMS Hypertension does not usually cause signs or symptoms. Extremely high blood pressure (hypertensive crisis) may cause headache, anxiety, shortness of breath, and nosebleed. DIAGNOSIS To check if you have hypertension, your health care provider will measure your blood pressure while you are seated, with your arm held at the level of your heart. It should be measured at least twice using the same arm. Certain conditions can cause a difference in blood pressure between your right and left arms. A blood pressure reading that is higher than normal on one occasion does not mean that you need treatment. If  it is not clear whether you have high blood pressure, you may be asked to return on a different day to have your blood pressure checked again. Or, you may be asked to monitor your blood pressure at home for 1 or more weeks. TREATMENT Treating high blood pressure includes making lifestyle changes and possibly taking medicine. Living a healthy lifestyle can help lower high blood pressure. You may need to change some of your habits. Lifestyle changes may include:  Following the DASH diet. This diet is high in fruits, vegetables, and whole grains. It is low in salt, red meat, and added sugars.  Keep your sodium intake below 2,300 mg per day.  Getting at least 30-45 minutes of aerobic exercise at least 4 times per week.  Losing weight if necessary.  Not smoking.  Limiting alcoholic beverages.  Learning ways to reduce stress. Your health care provider may prescribe medicine if lifestyle changes are not enough to get your blood pressure under control, and if one of the following is true:  You are 18-59 years of age and your systolic blood pressure is above 140.  You are 60 years of age or older, and your systolic blood pressure is above 150.  Your diastolic blood pressure is above 90.  You have diabetes, and your systolic blood pressure is over 140 or your diastolic blood pressure is over 90.  You have kidney disease and your blood pressure is above 140/90.  You have heart disease and your blood pressure is above 140/90. Your personal target blood pressure may vary depending on your medical conditions, your age, and other factors. HOME CARE INSTRUCTIONS    Have your blood pressure rechecked as directed by your health care provider.   Take medicines only as directed by your health care provider. Follow the directions carefully. Blood pressure medicines must be taken as prescribed. The medicine does not work as well when you skip doses. Skipping doses also puts you at risk for  problems.  Do not smoke.   Monitor your blood pressure at home as directed by your health care provider. SEEK MEDICAL CARE IF:   You think you are having a reaction to medicines taken.  You have recurrent headaches or feel dizzy.  You have swelling in your ankles.  You have trouble with your vision. SEEK IMMEDIATE MEDICAL CARE IF:  You develop a severe headache or confusion.  You have unusual weakness, numbness, or feel faint.  You have severe chest or abdominal pain.  You vomit repeatedly.  You have trouble breathing. MAKE SURE YOU:   Understand these instructions.  Will watch your condition.  Will get help right away if you are not doing well or get worse.   This information is not intended to replace advice given to you by your health care provider. Make sure you discuss any questions you have with your health care provider.   Document Released: 11/07/2005 Document Revised: 03/24/2015 Document Reviewed: 08/30/2013 Elsevier Interactive Patient Education 2016 [employer] Inc.  

## 2015-12-17 ENCOUNTER — Ambulatory Visit (INDEPENDENT_AMBULATORY_CARE_PROVIDER_SITE_OTHER): Payer: BC Managed Care – PPO | Admitting: Certified Nurse Midwife

## 2015-12-17 ENCOUNTER — Encounter: Payer: Self-pay | Admitting: Certified Nurse Midwife

## 2015-12-17 VITALS — BP 127/88 | HR 93 | Temp 99.1°F | Wt 228.0 lb

## 2015-12-17 DIAGNOSIS — Z9189 Other specified personal risk factors, not elsewhere classified: Secondary | ICD-10-CM

## 2015-12-18 ENCOUNTER — Telehealth (HOSPITAL_COMMUNITY): Payer: Self-pay | Admitting: Lactation Services

## 2015-12-18 NOTE — Telephone Encounter (Signed)
Mackenzie Moreno from the [facility] asked that we call this mom for assist with breastfeeding. I talked with mom and she reports she has been supplementing with both breast milk and formula. Has Medela pump but is not able to keep up with baby's demand. Suggested putting baby to the breast when she is not real hungry/frustrated, Encouraged to continue pumping q 3 hours to promote milk supply. Suggested skin to skin and allowing baby time at the breast as much as possible . Mom states she may just pump and bottle feed EBM so that baby is getting breast milk. Reviewed OP appointments if wants assist with latching baby No further questions at present. To call prn

## 2015-12-19 NOTE — Progress Notes (Signed)
Patient ID: Mackenzie Moreno, female   DOB: 1976/03/31, 41 y.o.   MRN: [id number]  Subjective:     Mackenzie Moreno is a 40 y.o. female who presents for a postpartum visit. She is 2 weeks postpartum following a spontaneous vaginal delivery. I have fully reviewed the prenatal and intrapartum course. The delivery was at 39 gestational weeks. Outcome: spontaneous vaginal delivery. Anesthesia: epidural. Postpartum course has been hypertension. Baby's course has been normal. Baby is feeding by both breast and bottle - Similac Advance. Bleeding thin lochia, brown and red. Bowel function is normal. Bladder function is normal. Patient is not sexually active. Contraception method is abstinence. Postpartum depression screening: negative.  Tobacco, alcohol and substance abuse history reviewed.  Adult immunizations reviewed including TDAP, rubella and varicella.  Patient states infant is having difficulty latching.  Has been pumping and supplementing with formula.  States that she is not able to keep up with infant demand.    The following portions of the patient's history were reviewed and updated as appropriate: allergies, current medications, past family history, past medical history, past social history, past surgical history and problem list.  Review of Systems Pertinent items noted in HPI and remainder of comprehensive ROS otherwise negative.   Objective:    BP 127/88 mmHg  Pulse 93  Temp(Src) 99.1 F (37.3 C)  Wt 228 lb (103.42 kg)  Breastfeeding? Yes  General:  alert, cooperative and no distress   Breasts:  inspection negative, no nipple discharge or bleeding, no masses or nodularity palpable  Lungs: clear to auscultation bilaterally  Heart:  regular rate and rhythm, S1, S2 normal, no murmur, click, rub or gallop  Abdomen: soft, non-tender; bowel sounds normal; no masses,  no organomegaly   Vulva:  not evaluated  Vagina: not evaluated  Cervix:  not evaluated  Corpus: not examined  Adnexa:  not  evaluated  Rectal Exam: Not performed.          50% of 25 min visit spent on counseling and coordination of care.  Assessment:     Normal 2 week postpartum exam. Pap smear not done at today's visit.    Breastfeeding difficulties  Blood pressure stable Plan:    1. Contraception: abstinence 2. Breast feeding difficulties: lactation consult 3. Follow up in: 2 weeks or as needed.  2hr GTT for h/o GDM/screening for DM q 3 yrs per ADA recommendations Preconception counseling provided Healthy lifestyle practices reviewed

## 2015-12-24 ENCOUNTER — Ambulatory Visit: Payer: BC Managed Care – PPO | Admitting: Certified Nurse Midwife

## 2015-12-31 ENCOUNTER — Telehealth: Payer: Self-pay | Admitting: *Deleted

## 2015-12-31 NOTE — Telephone Encounter (Signed)
Patient called complaining of a rash on her face and low milk supply she thinks is due to her BP medication. 5:00 Dr Clearance Coots called patient and discussed her symptoms.He advised her to use Benadryl for her rash. He doesn't think the BP medication caused the problem. She is to stop her Coreg and Norvasc and come on Monday for a BP check. He advised her on some ways to increase her milk production- increase fluids, fenugreek, etc..

## 2016-01-07 ENCOUNTER — Other Ambulatory Visit (INDEPENDENT_AMBULATORY_CARE_PROVIDER_SITE_OTHER): Payer: BC Managed Care – PPO | Admitting: Certified Nurse Midwife

## 2016-01-07 VITALS — BP 144/89 | HR 82 | Wt 222.0 lb

## 2016-01-07 DIAGNOSIS — Z9189 Other specified personal risk factors, not elsewhere classified: Secondary | ICD-10-CM

## 2016-01-07 MED ORDER — METOCLOPRAMIDE HCL 10 MG PO TABS: 10.0000 mg | ORAL_TABLET | Freq: Four times a day (QID) | ORAL | Status: DC

## 2016-01-08 NOTE — Progress Notes (Signed)
Patient ID: Mackenzie Moreno, female   DOB: [birth year], 40 y.o.   MRN: [id number]  Chief Complaint  Patient presents with  . Blood Pressure Check    medication problem    HPI Mackenzie Moreno is a 40 y.o. female.  Patient here for blood pressure evaluation.  Has a hx of HTN previous to pregnancy, is on Maxzide.  Did not take her medication today.  States that she has been doing well.  6 Week postpartum exam is scheduled for next week.   States that she has been pumping and not putting baby to breast.  When pumping is getting around 20-30 cc in 20 minutes per side.  Supplementing with formula.  Is doing mother's milk tea, and special milk shakes.  Started those yesturday.  Has been taking fenugreek for a few days also.  Has increased her water intake significantly and is eating well.  Denies any other problems.    HPI  Past Medical History  Diagnosis Date  . Hypertension   . Anemia   . Missed ab     Past Surgical History  Procedure Laterality Date  . Wisdom tooth extraction    . Cholecystectomy  2005  . Tonsillectomy  2007  . Dilation and evacuation  05/18/2012    Procedure: DILATATION AND EVACUATION;  Surgeon: Annamaria Boots, MD;  Location: [facility];  Service: Gynecology;  Laterality: N/A;  . Cholecystectomy      Family History  Problem Relation Age of Onset  . Cancer Mother   . COPD Mother   . Hypertension Mother   . Heart disease Mother   . Heart disease Father   . Hypertension Brother     Social History Social History  Substance Use Topics  . Smoking status: Former Games developer  . Smokeless tobacco: Never Used  . Alcohol Use: 0.0 oz/week    0 Standard drinks or equivalent per week     Comment: social    Allergies  Allergen Reactions  . Vicodin [Hydrocodone-Acetaminophen] Rash    Current Outpatient Prescriptions  Medication Sig Dispense Refill  . amLODipine (NORVASC) 10 MG tablet Take 1 tablet (10 mg total) by mouth daily. 30 tablet 2  . carvedilol (COREG) 25 MG  tablet Take 1 tablet (25 mg total) by mouth 2 (two) times daily with a meal. 60 tablet 2  . ibuprofen (ADVIL,MOTRIN) 600 MG tablet Take 1 tablet (600 mg total) by mouth every 6 (six) hours as needed for mild pain. 30 tablet 5  . metoCLOPramide (REGLAN) 10 MG tablet Take 1 tablet (10 mg total) by mouth 4 (four) times daily. 120 tablet PRN  . Prenatal Vit-Fe Fumarate-FA (PRENATAL MULTIVITAMIN) TABS tablet Take 1 tablet by mouth daily at 12 noon.    . triamterene-hydrochlorothiazide (MAXZIDE-25) 37.5-25 MG tablet Take 1 tablet by mouth daily. 30 tablet 2   No current facility-administered medications for this visit.    Review of Systems Review of Systems Constitutional: negative for fatigue and weight loss Respiratory: negative for cough and wheezing Cardiovascular: negative for chest pain, fatigue and palpitations Gastrointestinal: negative for abdominal pain and change in bowel habits Genitourinary:negative Integument/breast: negative for nipple discharge Musculoskeletal:negative for myalgias Neurological: negative for gait problems and tremors Behavioral/Psych: negative for abusive relationship, depression Endocrine: negative for temperature intolerance     Blood pressure 144/89, pulse 82, weight 222 lb (100.699 kg), currently breastfeeding.  Physical Exam Physical Exam Deferred General:   alert    100% of 15 min visit spent on counseling  and coordination of care.   Data Reviewed Previous medical hx, meds  Assessment     Breastfeeding problem Chronic HTN     Plan    Orders Placed This Encounter  Procedures  . Consult to lactation   Meds ordered this encounter  Medications  . metoCLOPramide (REGLAN) 10 MG tablet    Sig: Take 1 tablet (10 mg total) by mouth 4 (four) times daily.    Dispense:  120 tablet    Refill:  PRN    Rx for Oxytocin nasal spray given to patient Follow up as scheduled.

## 2016-01-14 ENCOUNTER — Encounter: Payer: Self-pay | Admitting: Certified Nurse Midwife

## 2016-01-14 ENCOUNTER — Ambulatory Visit (INDEPENDENT_AMBULATORY_CARE_PROVIDER_SITE_OTHER): Payer: BC Managed Care – PPO | Admitting: Certified Nurse Midwife

## 2016-01-14 MED ORDER — NORETHINDRONE 0.35 MG PO TABS: 1.0000 | ORAL_TABLET | Freq: Every day | ORAL | Status: DC

## 2016-01-15 NOTE — Progress Notes (Signed)
Patient ID: Mackenzie Moreno, female   DOB: 1976/10/28, 40 y.o.   MRN: [id number]  Subjective:     Mackenzie Moreno is a 40 y.o. female who presents for a postpartum visit. She is 6 weeks postpartum following a spontaneous vaginal delivery. I have fully reviewed the prenatal and intrapartum course. The delivery was at 39 gestational weeks. Outcome: spontaneous vaginal delivery. Anesthesia: epidural. Postpartum course has been normal. Baby's course has been normla. Baby is feeding by both breast and bottle - Similac Advance. Bleeding no bleeding and started her regular period yesterday. Bowel function is normal. Bladder function is normal. Patient is not sexually active. Contraception method is abstinence. Postpartum depression screening: negative.  Tobacco, alcohol and substance abuse history reviewed.  Adult immunizations reviewed including TDAP, rubella and varicella.  The following portions of the patient's history were reviewed and updated as appropriate: allergies, current medications, past family history, past medical history, past social history, past surgical history and problem list.  Review of Systems Pertinent items noted in HPI and remainder of comprehensive ROS otherwise negative.   Objective:    BP 125/91 mmHg  Pulse 91  Temp(Src) 99 F (37.2 C)  Wt 221 lb (100.245 kg)  Breastfeeding? No  General:  alert, cooperative and no distress   Breasts:  inspection negative, no nipple discharge or bleeding, no masses or nodularity palpable  Lungs: clear to auscultation bilaterally  Heart:  regular rate and rhythm, S1, S2 normal, no murmur, click, rub or gallop  Abdomen: soft, non-tender; bowel sounds normal; no masses,  no organomegaly   Vulva:  normal  Vagina: normal vagina  Cervix:  no cervical motion tenderness  Corpus: normal size, contour, position, consistency, mobility, non-tender  Adnexa:  normal adnexa  Rectal Exam: Not performed.          50% of 30 min visit spent on  counseling and coordination of care.  Assessment:     Normal 6 week postpartum exam. Pap smear not done at today's visit.  Plan:    1. Contraception: abstinence and oral progesterone-only contraceptive 2. Discussed changing to OCPs when completed breastfeeding.   3.  Encouraged f/u with lactation for latch issues.   4. Work release paperwork completed 5. Follow up in: 1 year or as needed.  2hr GTT for h/o GDM/screening for DM q 3 yrs per ADA recommendations Preconception counseling provided Healthy lifestyle practices reviewed

## 2016-01-21 ENCOUNTER — Ambulatory Visit: Payer: BC Managed Care – PPO | Admitting: Certified Nurse Midwife

## 2016-03-30 ENCOUNTER — Other Ambulatory Visit (HOSPITAL_COMMUNITY): Payer: Self-pay | Admitting: Obstetrics

## 2016-03-31 ENCOUNTER — Telehealth: Payer: Self-pay | Admitting: *Deleted

## 2016-03-31 NOTE — Telephone Encounter (Signed)
Patient is calling for a refill of her BP medication.

## 2016-04-01 ENCOUNTER — Other Ambulatory Visit: Payer: Self-pay | Admitting: *Deleted

## 2016-04-01 MED ORDER — TRIAMTERENE-HCTZ 37.5-25 MG PO TABS: 1.0000 | ORAL_TABLET | Freq: Every day | ORAL | Status: DC

## 2016-04-01 NOTE — Progress Notes (Signed)
Pt called for refill on BP meds. Per Dr Clearance CootsHarper, refill to be sent.  Pt states that she is only taking Maxide at this time. Pt made aware to f/u with PCP or if she would like a referral to call back to office once she decides.

## 2016-04-02 NOTE — Telephone Encounter (Signed)
Patient should call and find a PCP.

## 2016-04-05 NOTE — Telephone Encounter (Signed)
Patient has been notified

## 2016-12-12 ENCOUNTER — Telehealth: Payer: Self-pay | Admitting: *Deleted

## 2016-12-12 ENCOUNTER — Other Ambulatory Visit: Payer: Self-pay | Admitting: Obstetrics

## 2016-12-12 NOTE — Telephone Encounter (Signed)
Call placed to pt making her aware that Dr [facility] Pt advised she is due for AEX end of February.

## 2017-01-31 IMAGING — US US MFM FETAL BPP W/O NON-STRESS
1 series · 12 of 15 positions shown · non-contrast
Comparison: none

[Series 1: us mfm fetal bpp w/o non-stress · 15 acquisitions, 12 frames shown]
[im 1/15]
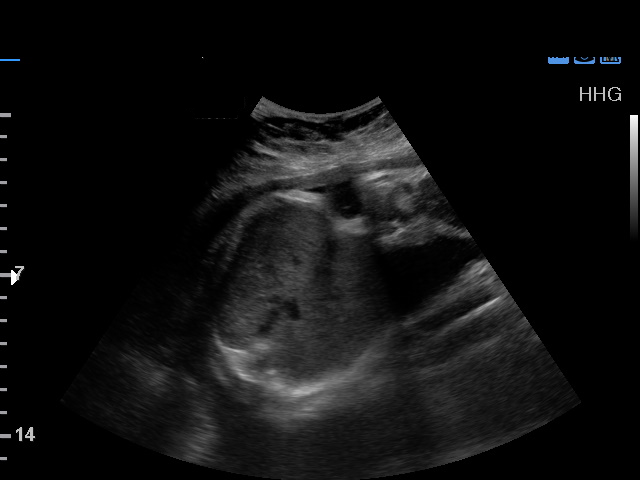
[im 2/15]
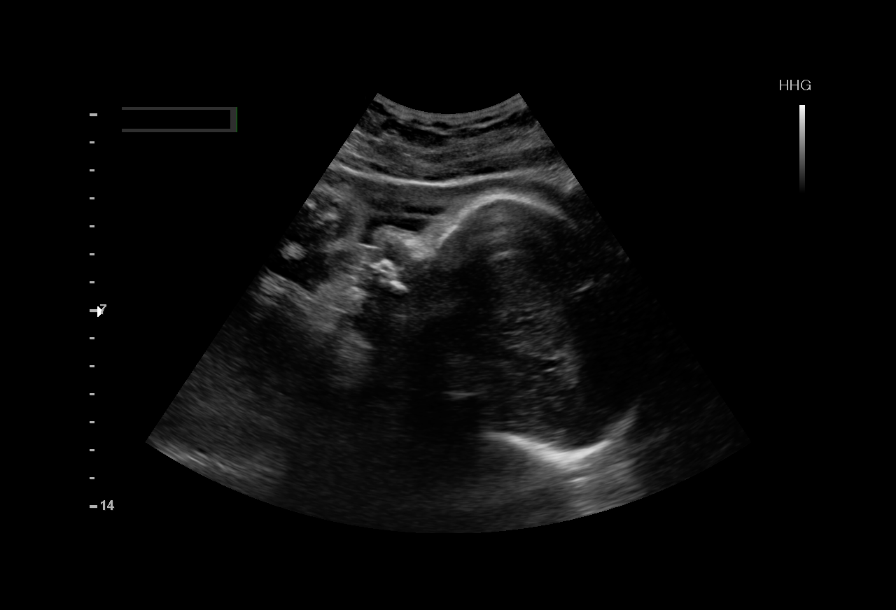
[im 4/15]
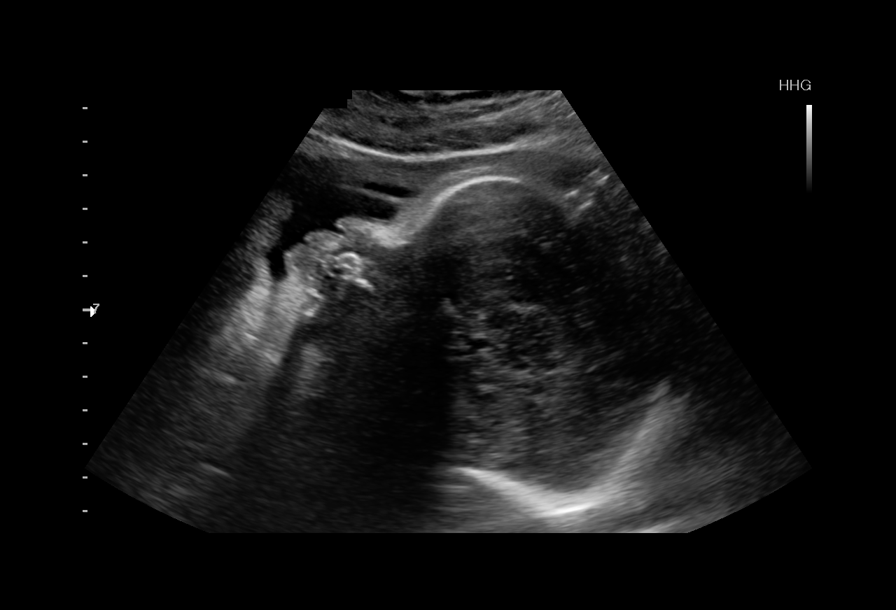
[im 5/15]
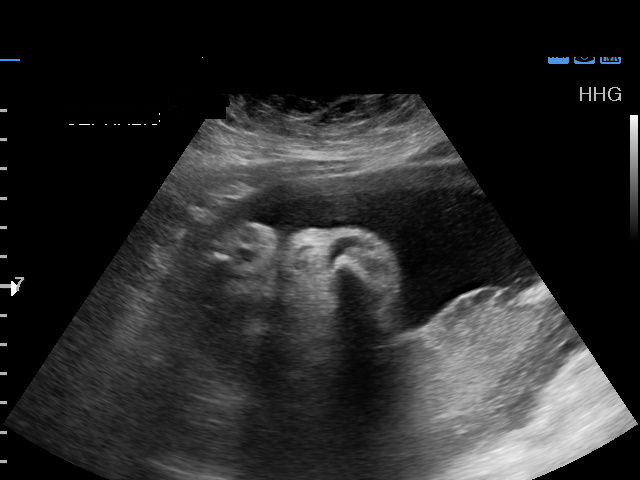
[im 6/15]
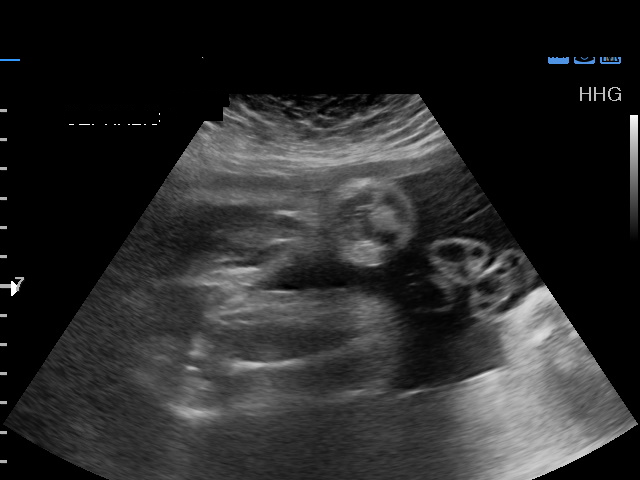
[im 7/15]
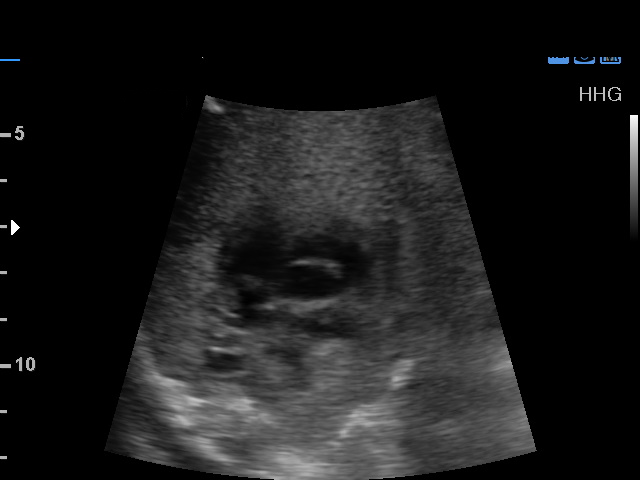
[im 9/15]
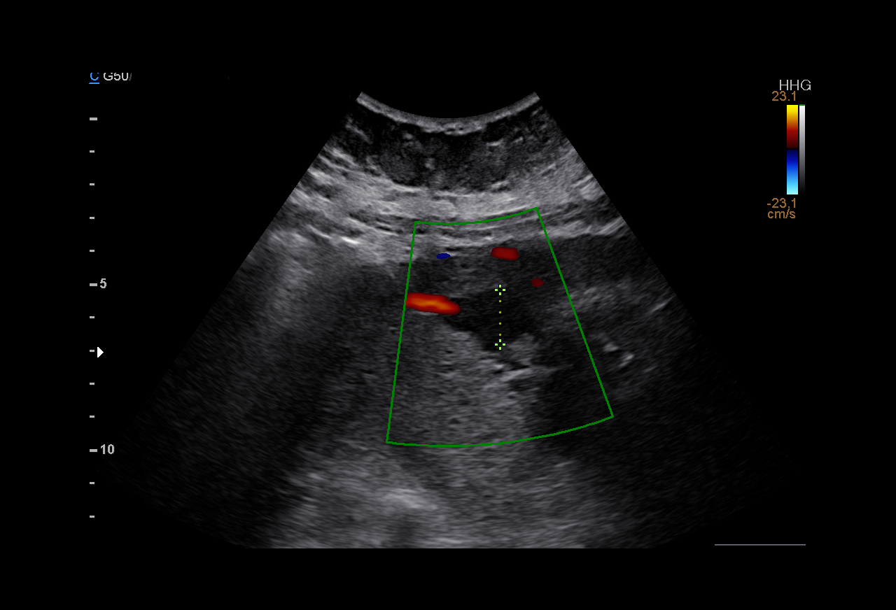
[im 10/15]
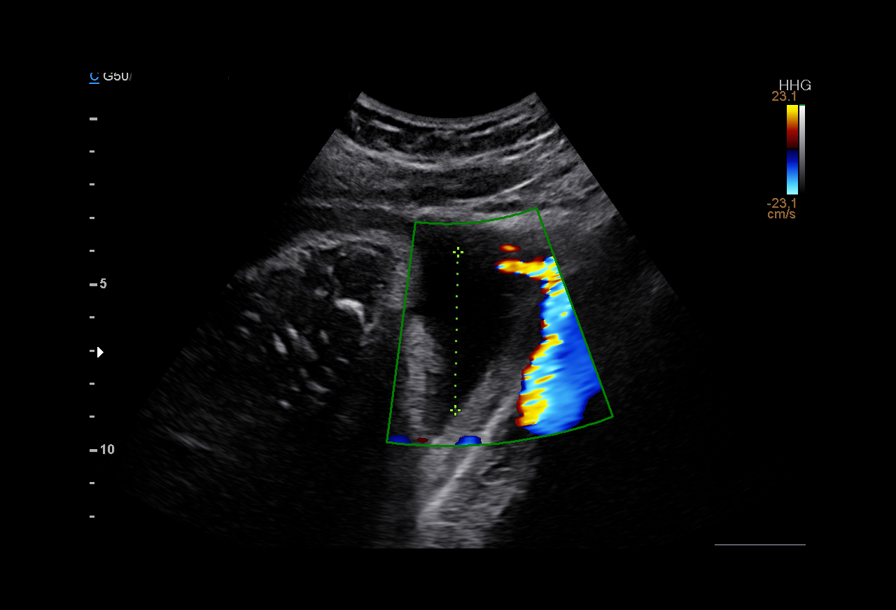
[im 11/15]
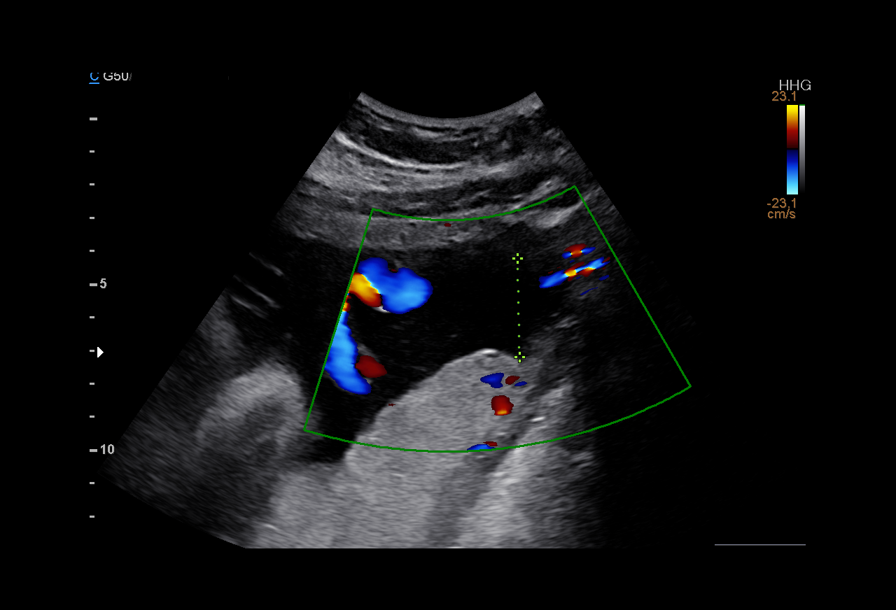
[im 12/15]
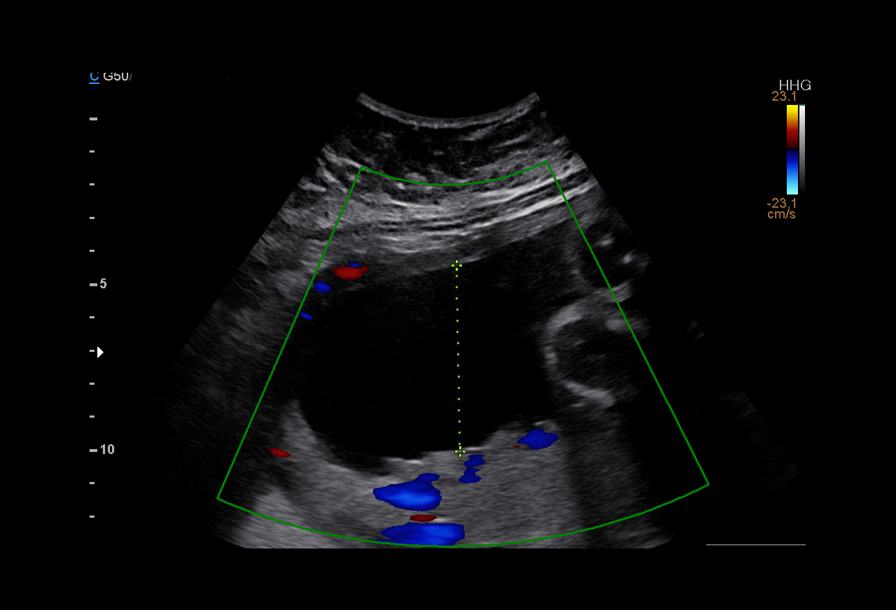
[im 14/15]
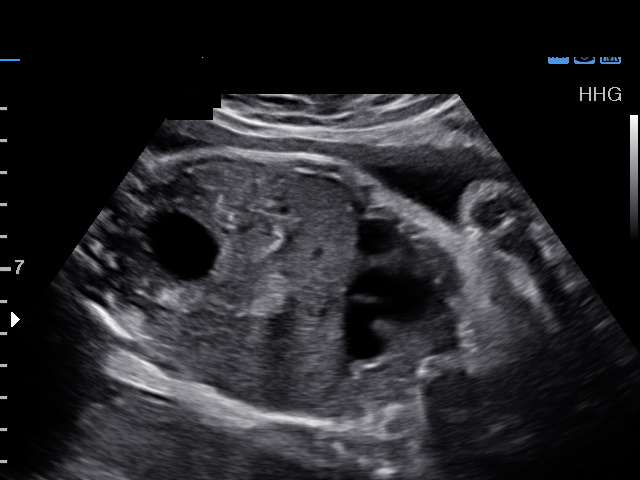
[im 15/15]
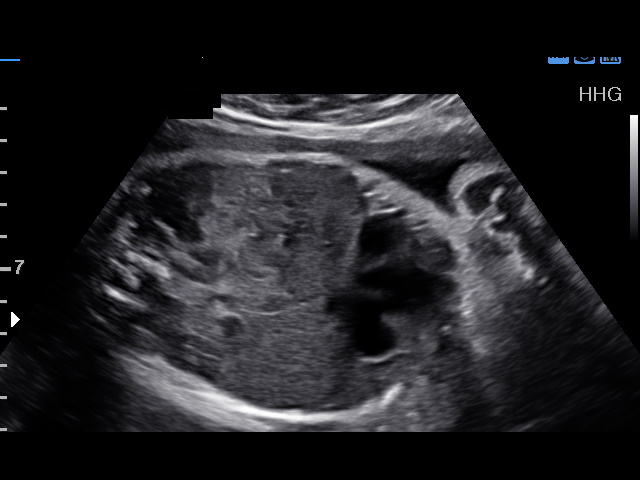

[12 of 15 positions shown; findings below may reference images not displayed]

OBSTETRICS REPORT
(Signed Final 10/13/2015 [DATE])

Name:       ERVISTA CEOLA                        Visit  10/13/2015 [DATE]
Date:

Service(s) Provided

Indications

Echogenic bowel (first trimester, ? SCH) -
declined CF screen, TORCH titers
Hypertension - Chronic/Pre-existing (labetalol)
Advanced maternal age multigravida (39), third
trimester - Mario/Labelle Salha Brack Tiger, declined genetic testing
Obesity complicating pregnancy, third trimester
Polyhydramnios, third trimester, antepartum
condition or complication, unspecified fetus
31 weeks gestation of pregnancy
Fetal Evaluation

Num Of             1
Fetuses:
Fetal Heart        158                          bpm
Rate:
Cardiac Activity:  Observed
Presentation:      Cephalic

Comment     BPP [DATE]
:

Amniotic Fluid
AFI FV:      Subjectively within normal limits
AFI Sum:     14.98    cm      53  %Tile     Larg Pckt:    5.61   cm
RUQ:   1.64    cm    RLQ:   5.61    cm   LUQ:    4.76    cm   LLQ:    2.97   cm
Biophysical Evaluation

Amniotic F.V:   Pocket => 2 cm two          F. Tone:        Observed
planes
F. Movement:    Observed                    Score:          [DATE]
F. Breathing:   Observed
Gestational Age
LMP:           31w 5d        Date:  03/05/15                  EDD:   12/10/15
Best:          31w 5d    Det. By:   LMP  (03/05/15)           EDD:   12/10/15
Impression

Single IUP at 31w 5d
CHTN, echogenic bowel, AMA
BPP [DATE]
Normal amniotic fluid volume
Recommendations

Recommend 2x weekly NSTs with weekly AFIs
Ultrasound for growth in 3 weeks

## 2017-02-05 ENCOUNTER — Other Ambulatory Visit: Payer: Self-pay | Admitting: Obstetrics

## 2017-02-17 IMAGING — US US MFM FETAL BPP W/O NON-STRESS
1 series · 14 of 28 positions shown · non-contrast
Comparison: none

[Series 1: us mfm fetal bpp w/o non-stress · 40 acquisitions, 14 frames shown]
[im 2/40]
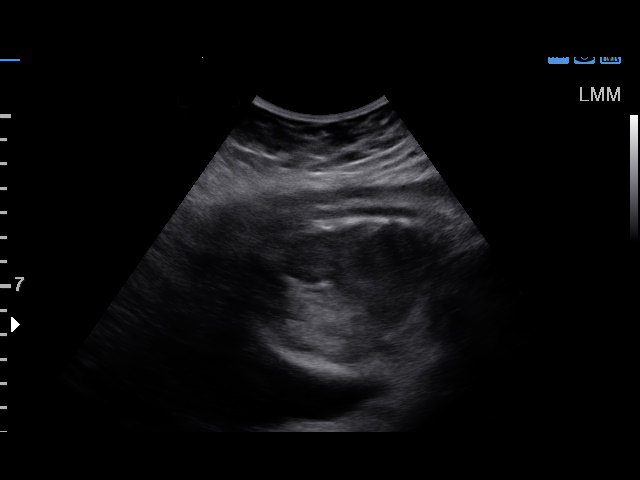
[im 5/40]
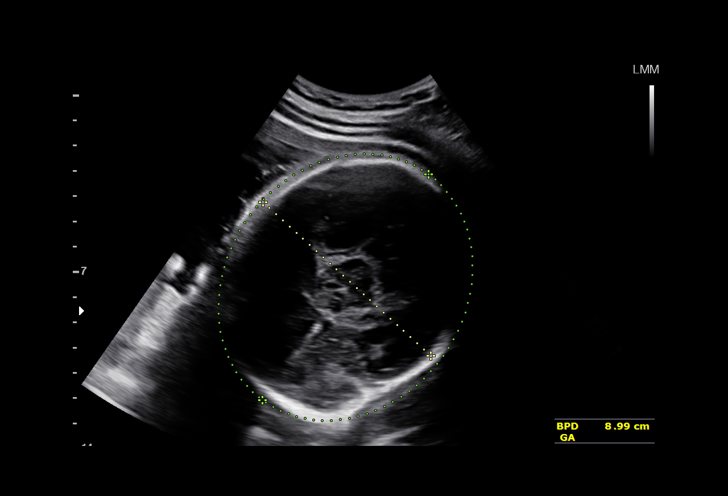
[im 8/40]
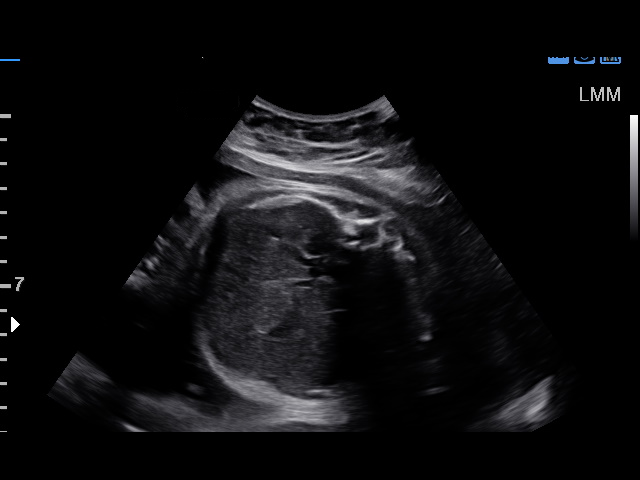
[im 11/40]
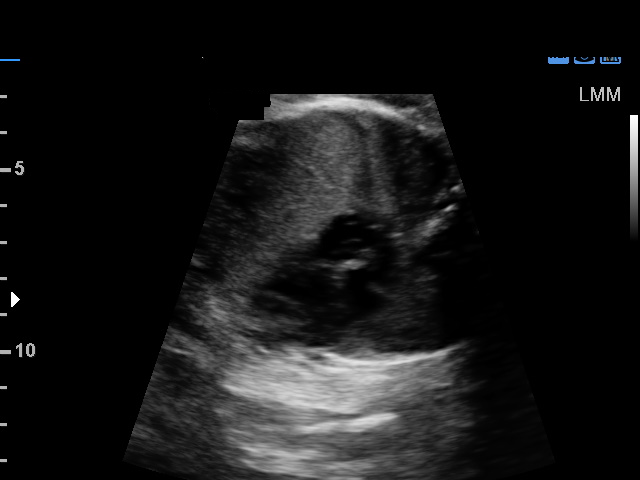
[im 14/40]
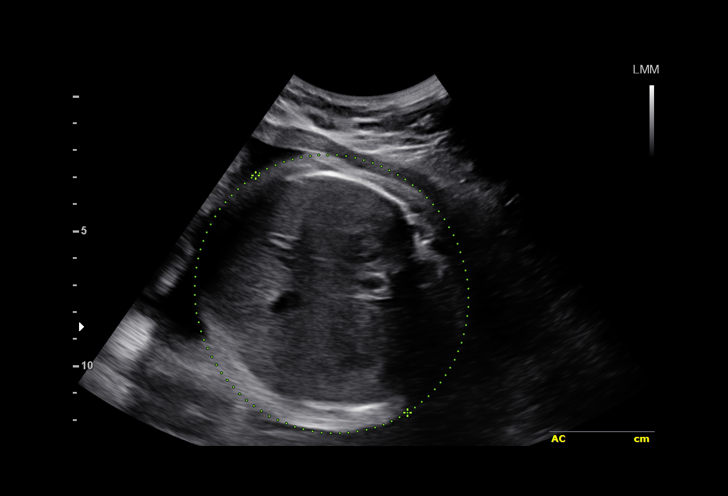
[im 16/40]
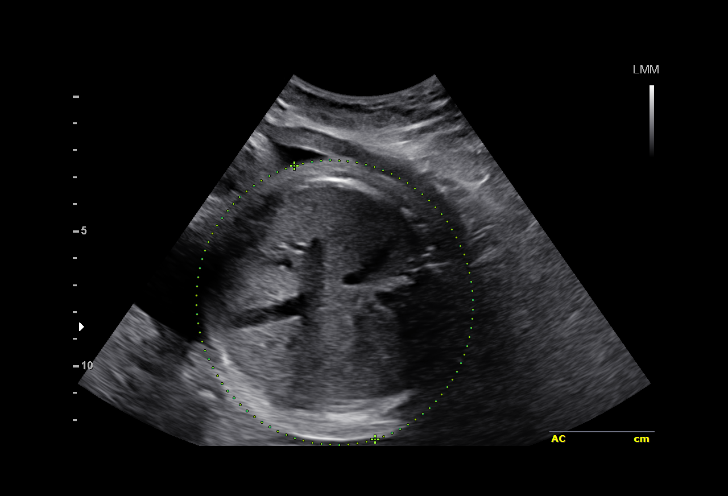
[im 19/40]
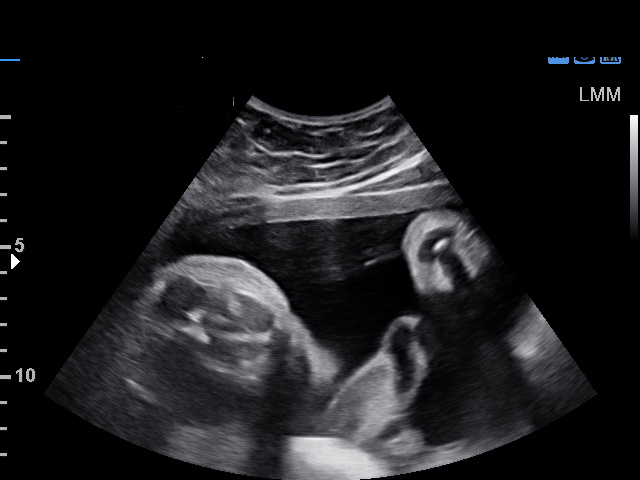
[im 22/40]
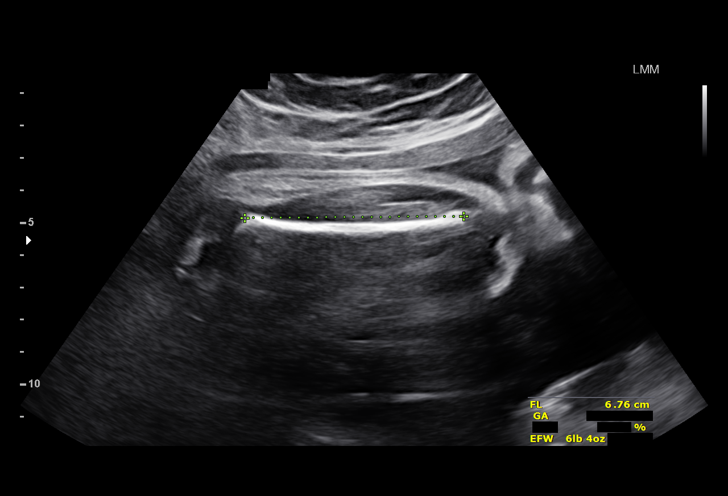
[im 25/40]
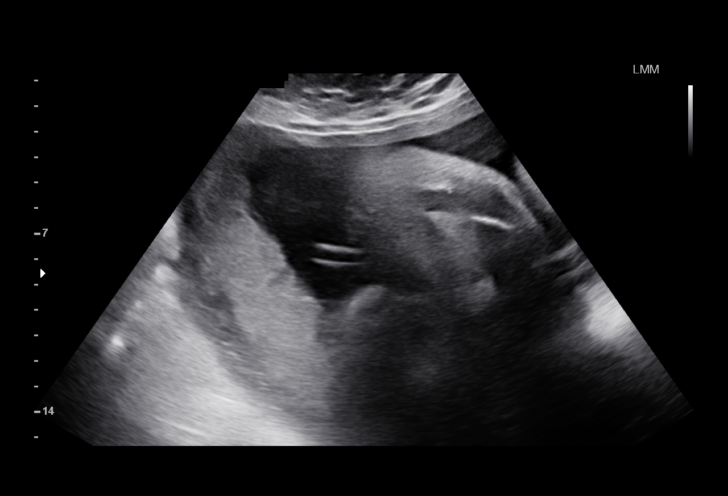
[im 28/40]
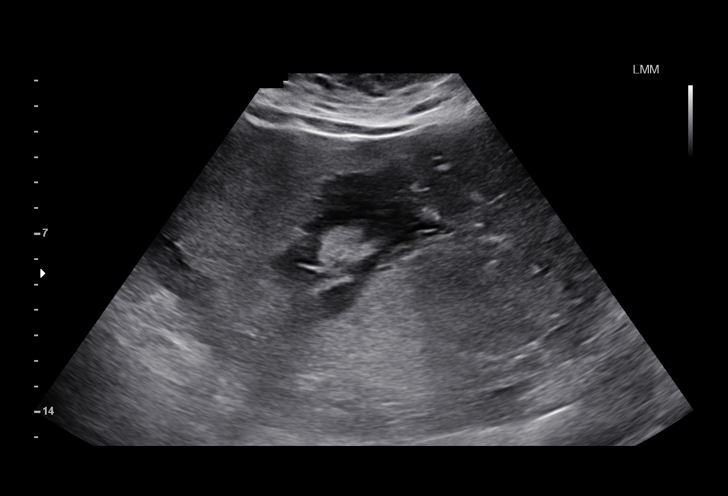
[im 31/40]
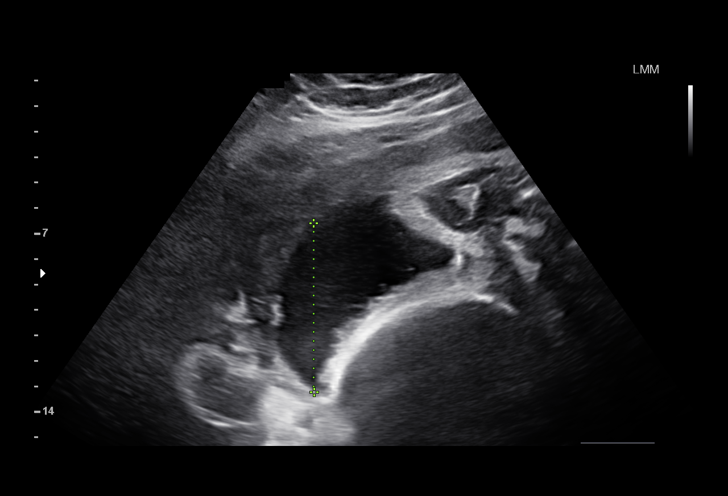
[im 34/40]
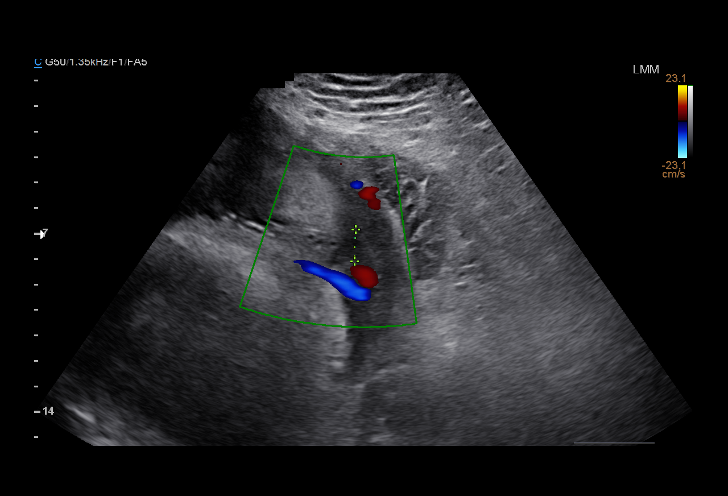
[im 37/40]
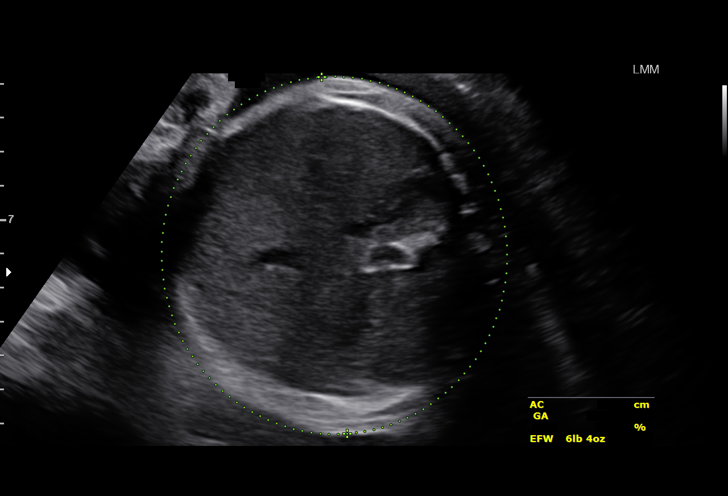
[im 40/40]
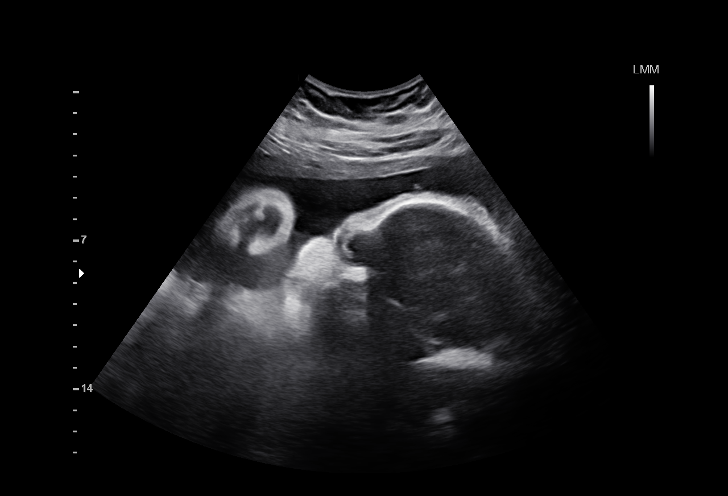

[14 of 28 positions shown; findings below may reference images not displayed]

am)

Name:       JAVONTE GERHARDT                        Visit  10/30/2015 [DATE]
Date:

Indications

34 weeks gestation of pregnancy
Hypertension - Chronic/Pre-existing on
labetalol
Advanced maternal age primigravida 35+,
third trimester; declined testing
Echogenic bowel (first trimester, ? SCH) -
declined CF screen, TORCH titers
Advanced maternal age multigravida (39),
third trimester - Sahadatu/Sneha Willy, declined genetic
testing
Obesity complicating pregnancy, third
trimester
OB History

Gravidity:     4         Term:  0        Prem:    0        SAB:   2
TOP:           1       Ectopic  0        Living:  0
:
Fetal Evaluation

Num Of Fetuses:      1
Fetal Heart          141
Rate(bpm):
Cardiac Activity:    Observed
Presentation:        Cephalic
Placenta:            Posterior Fundal, above cervical os
P. Cord Insertion:   Previously Visualized

Amniotic Fluid
AFI FV:      Subjectively within normal limits
AFI Sum:     21.29    cm      80  %Tile     Larg Pckt:    7.33   cm
RUQ:   6.62    cm    RLQ:   1.25    cm   LUQ:    6.09    cm   LLQ:    7.33   cm

Comment     BPP time = 9 minutes.
:
Biophysical Evaluation

Amniotic F.V:   Within normal limits        F. Tone:        Observed
F. Movement:    Observed                    Score:          [DATE]
F. Breathing:   Observed
Biometry

BPD:      89.2  mm     G. Age:   36w 1d                  CI:        76.01   %    70 - 86
FL/HC:      20.7   %    19.4 -
HC:      324.3  mm     G. Age:   36w 5d        78   %    HC/AC:      1.00        0.96 -
AC:      325.4  mm     G. Age:   36w 3d        97   %    FL/BPD      75.3   %    71 - 87
:
FL:       67.2  mm     G. Age:   34w 4d        52   %    FL/AC:      20.7   %    20 - 24
HUM:      61.1  mm     G. Age:   35w 3d        83   %

Est.        0668   gm    6 lb 3 oz      86   %
FW:
Gestational Age

LMP:           34w 1d        Date:  03/05/15                  EDD:   12/10/15
U/S Today:     36w 0d                                         EDD:   11/27/15
Best:          34w 1d    Det. By:   LMP  (03/05/15)           EDD:   12/10/15
Anatomy

Cranium:          Appears normal         Aortic Arch:       Previously seen
Fetal Cavum:      Previously seen        Ductal Arch:       Previously seen
Ventricles:       Appears normal         Diaphragm:         Previously seen
Choroid Plexus:   Previously seen        Stomach:           Appears normal,
left sided
Cerebellum:       Previously seen        Abdomen:           Appears normal
Posterior         Previously seen        Abdominal          Previously seen
Fossa:                                   Wall:
Nuchal Fold:      Not applicable (>20    Cord Vessels:      Previously seen
wks GA)
Face:             Orbits and profile     Kidneys:           Appear normal
previously seen
Lips:             Previously seen        Bladder:           Appears normal
Palate:           Previously seen        Spine:             Previously seen
Heart:            Previously seen        Upper              Previously seen
Extremities:
RVOT:             Previously seen        Lower              Previously seen
Extremities:
LVOT:             Previously seen
Other:   Female gender previously seen. Heels and 5th digit previously
seen. Technically difficult due to maternal habitus and fetal position.
Impression

SIUP at 34+1 weeks
Cephalic presentation
Normal interval anatomy; anatomic survey complete
Normal amniotic fluid volume
Appropriate interval growth with EFW at the 86th %tile
BPP [DATE]
Recommendations

Continue twice weekly NSTs with weekly AFIs

## 2017-03-03 IMAGING — US US MFM FETAL BPP W/O NON-STRESS
1 series · 15 of 19 positions shown · non-contrast
Comparison: none

[Series 1: us mfm fetal bpp w/o non-stress · 19 acquisitions, 15 frames shown]
[im 1/19]
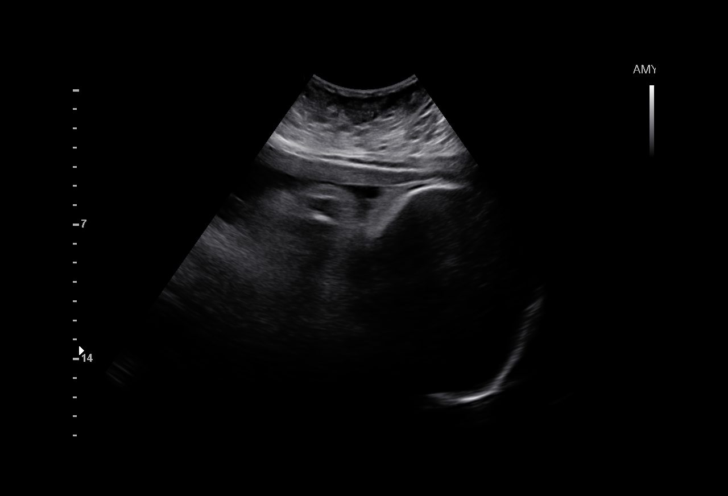
[im 2/19]
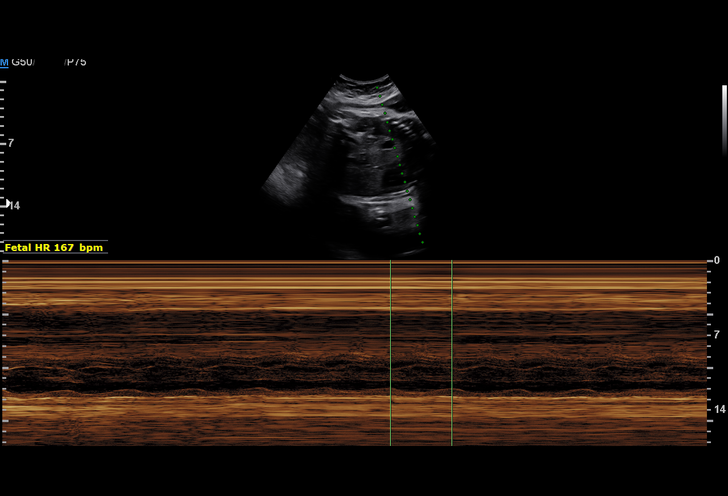
[im 4/19]
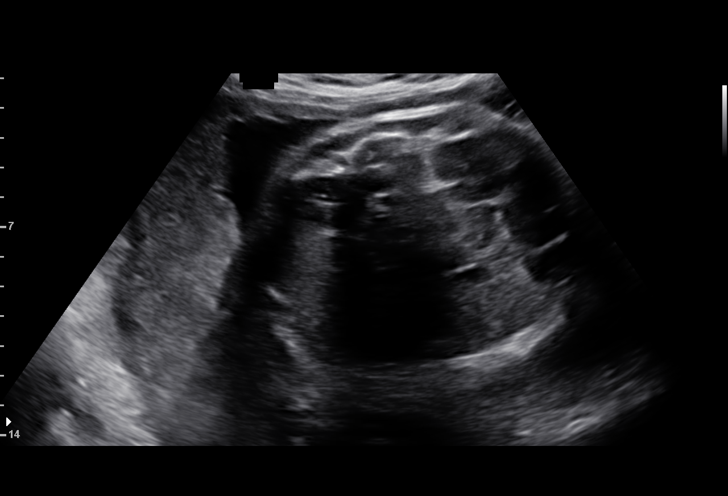
[im 5/19]
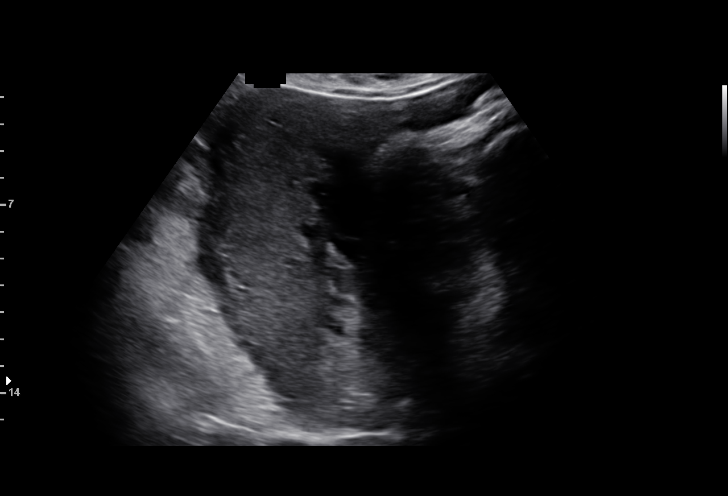
[im 6/19]
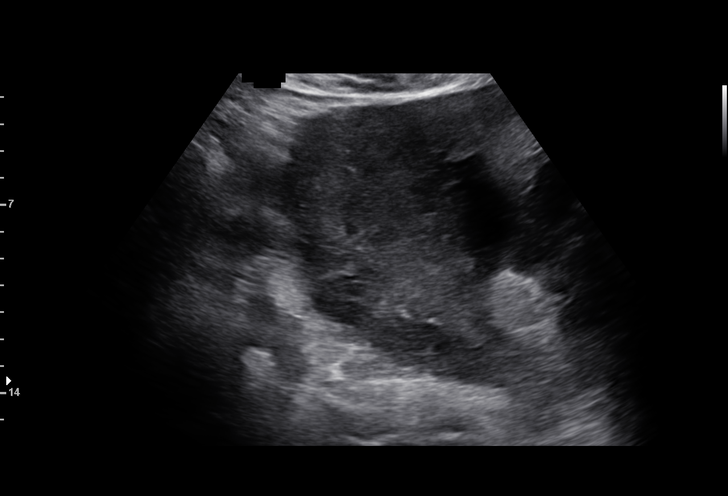
[im 7/19]
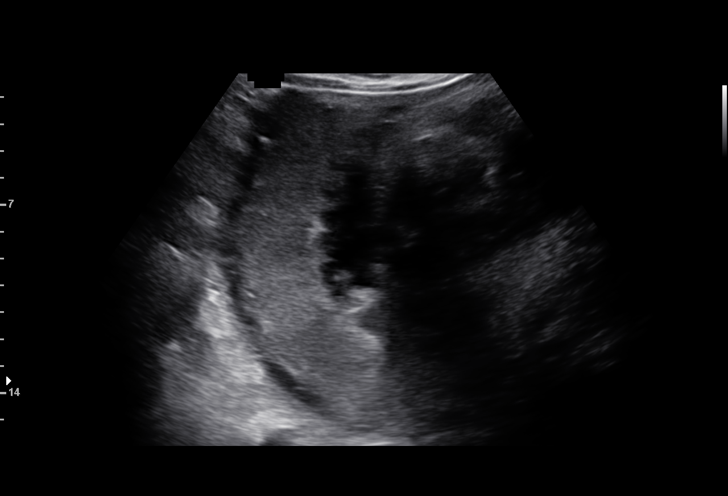
[im 9/19]
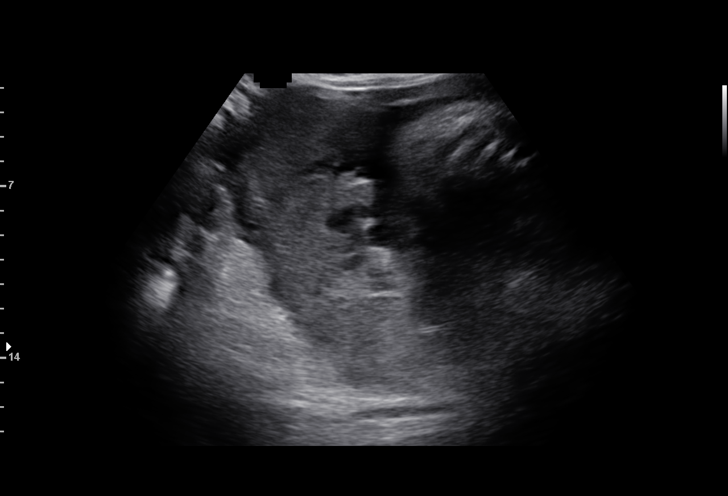
[im 10/19]
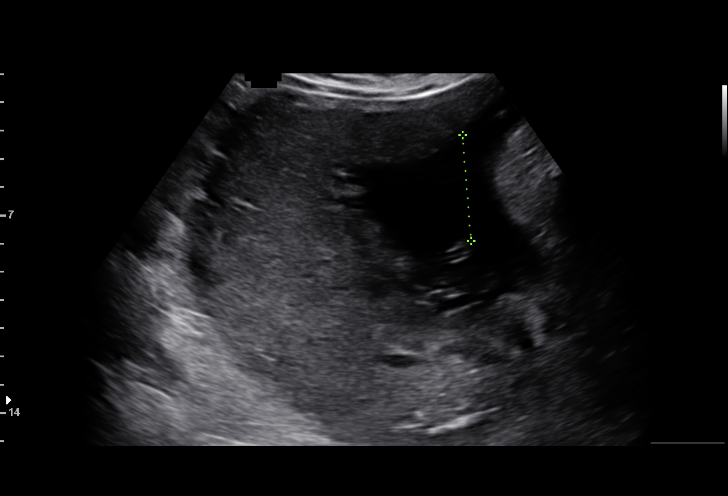
[im 11/19]
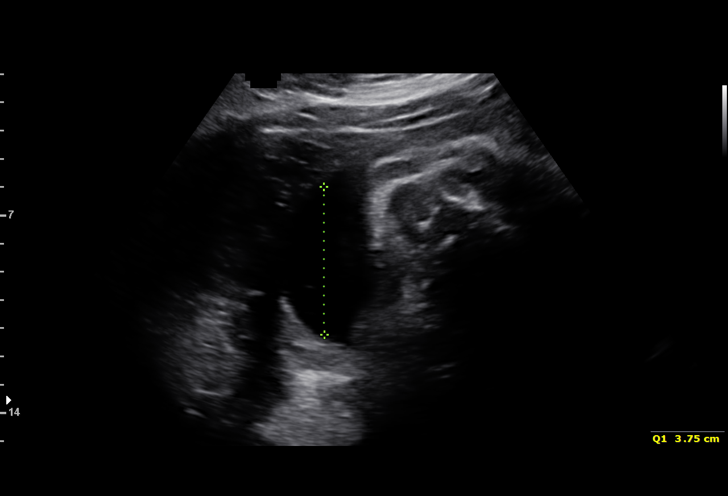
[im 13/19]
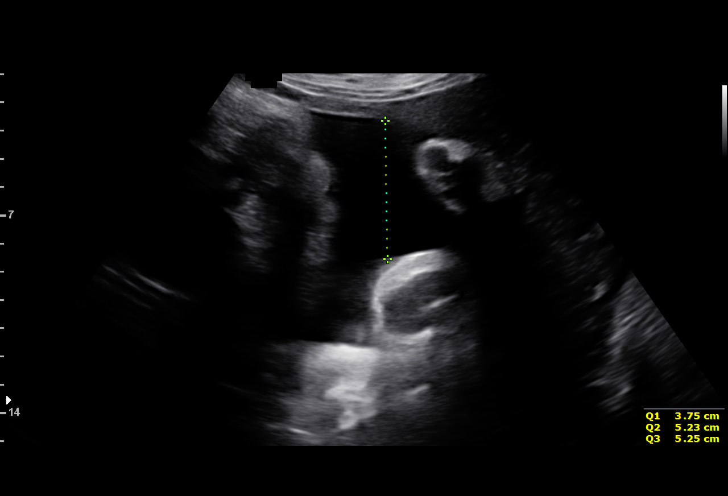
[im 14/19]
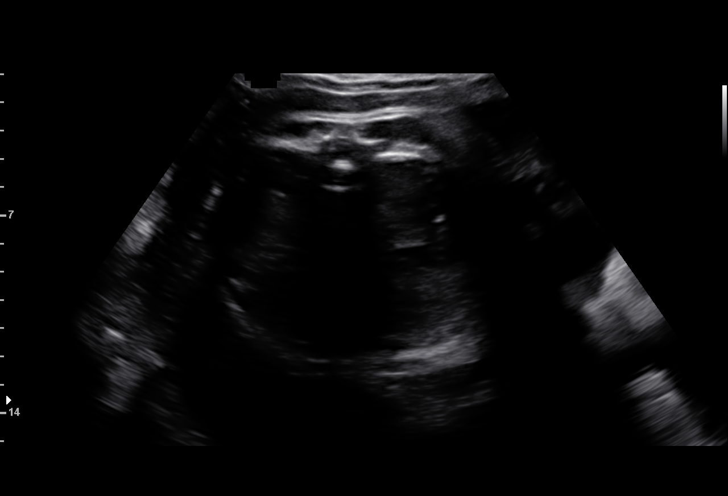
[im 15/19]
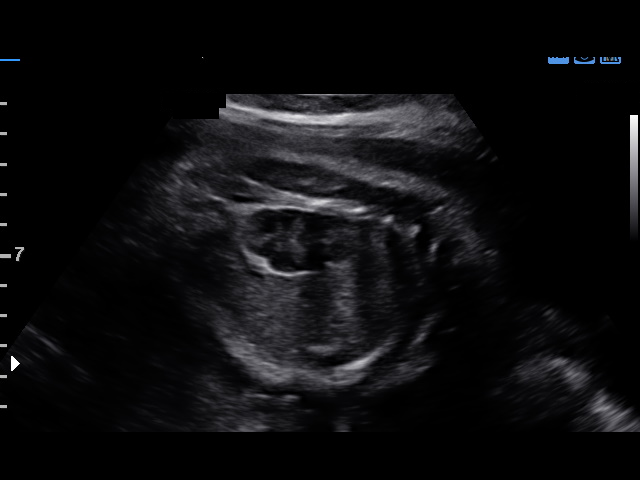
[im 16/19]
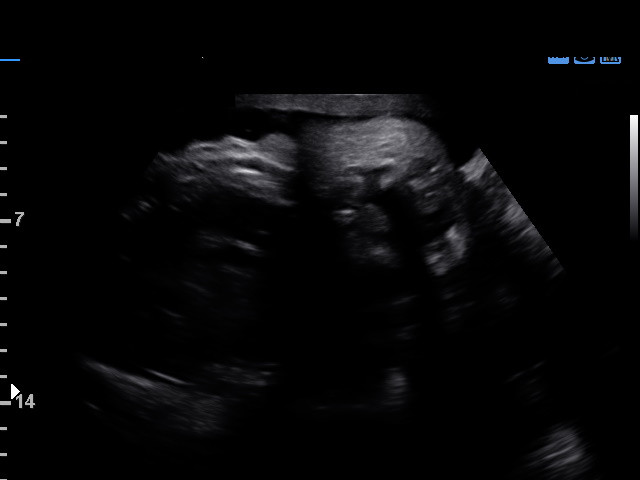
[im 18/19]
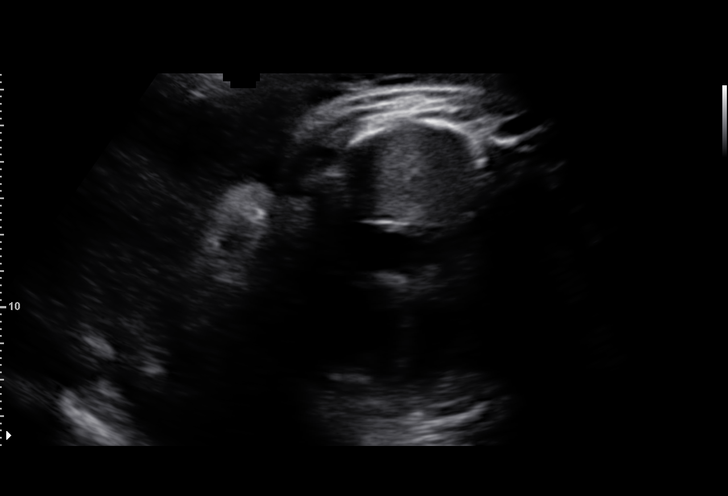
[im 19/19]
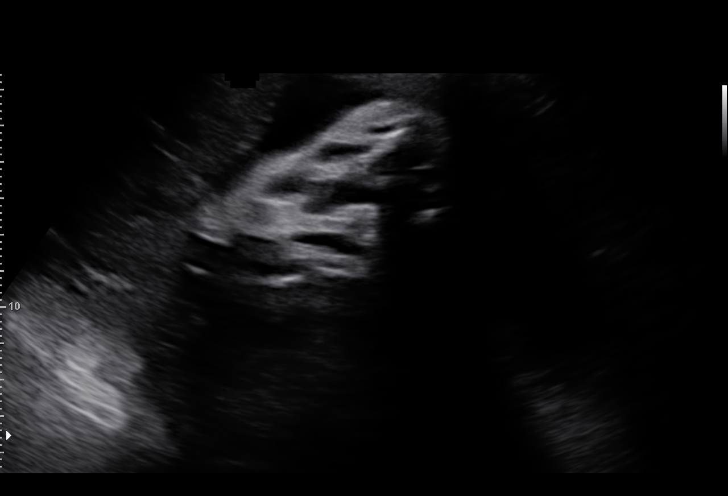

[15 of 19 positions shown; findings below may reference images not displayed]

am)

Name:       ALARIC PRINGLE                        Visit  11/13/2015 [DATE]
Date:

Indications

Hypertension - Chronic/Pre-existing on
labetalol
Echogenic bowel (first trimester, ? SCH) -
declined CF screen, TORCH titers
Advanced maternal age multigravida (39),
third trimester - Teodoro/Yareli Jamali, declined genetic
testing
Obesity complicating pregnancy, third
trimester
36 weeks gestation of pregnancy
OB History

Gravidity:     4         Term:  0        Prem:    0        SAB:   2
TOP:           1       Ectopic  0        Living:  0
:
Fetal Evaluation

Num Of Fetuses:      1
Fetal Heart          167
Rate(bpm):
Cardiac Activity:    Observed
Presentation:        Cephalic
Placenta:            Posterior Fundal, above cervical os
P. Cord Insertion:   Previously Visualized
Amniotic Fluid
AFI FV:      Subjectively within normal limits
AFI Sum:     390.37   cm    > 97  %Tile     Larg Pckt:     375   cm
RUQ:   375     cm    RLQ:   5.23    cm   LUQ:    5.25    cm   LLQ:    4.89   cm
Biophysical Evaluation

Amniotic F.V:   Within normal limits        F. Tone:        Observed
F. Movement:    Observed                    Score:          [DATE]
F. Breathing:   Observed
Gestational Age

LMP:           36w 1d        Date:  03/05/15                  EDD:   12/10/15
Best:          36w 1d    Det. By:   LMP  (03/05/15)           EDD:   12/10/15
Cervix Uterus Adnexa

Cervix
Not visualized (advanced GA >59wks)
Impression

Single IUP at 36w 1d
CHTN on Labetalol, echogenic bowel, AMA
BPP [DATE]
Normal amniotic fluid volume
Recommendations

Continue weekly BPPs
Ultrasound for growth in 2 weeks

## 2017-03-07 ENCOUNTER — Telehealth: Payer: Self-pay

## 2017-03-07 NOTE — Telephone Encounter (Signed)
TC from pt requests rf on BC. Contacted [facility] "Angelique Blonder" states pt has two months supply left on file. Pt also aware she needs an annual so she can get full year BC pills. Pt agrees and will c/b to schedule appt.

## 2017-03-17 IMAGING — US US MFM FETAL BPP W/O NON-STRESS
1 series · 14 of 28 positions shown · non-contrast
Comparison: none

[Series 1: us mfm fetal bpp w/o non-stress · 39 acquisitions, 14 frames shown]
[im 2/39]
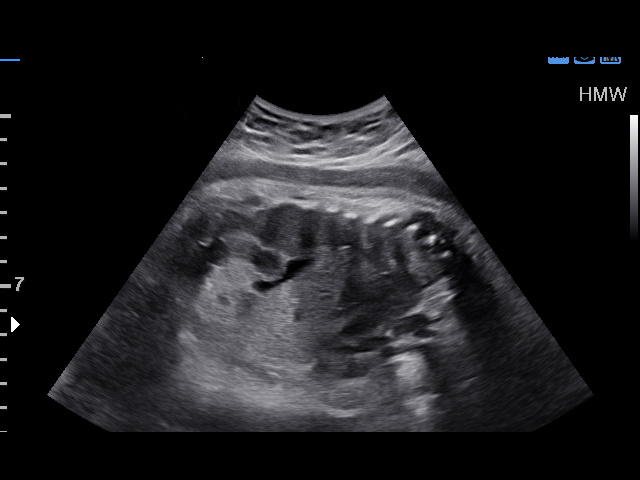
[im 5/39]
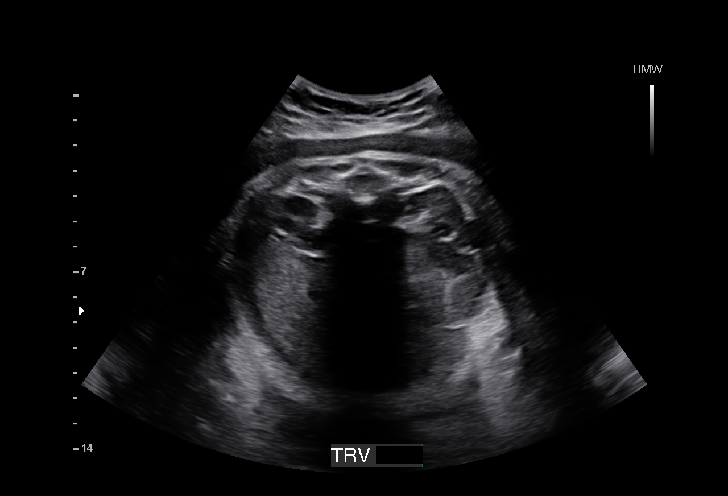
[im 8/39]
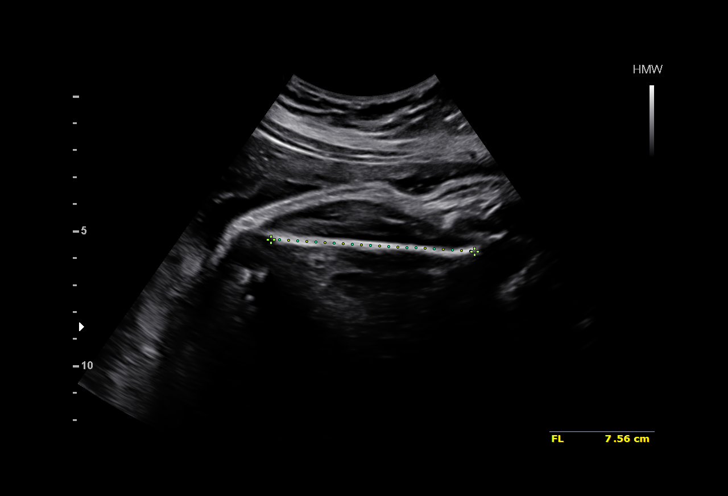
[im 10/39]
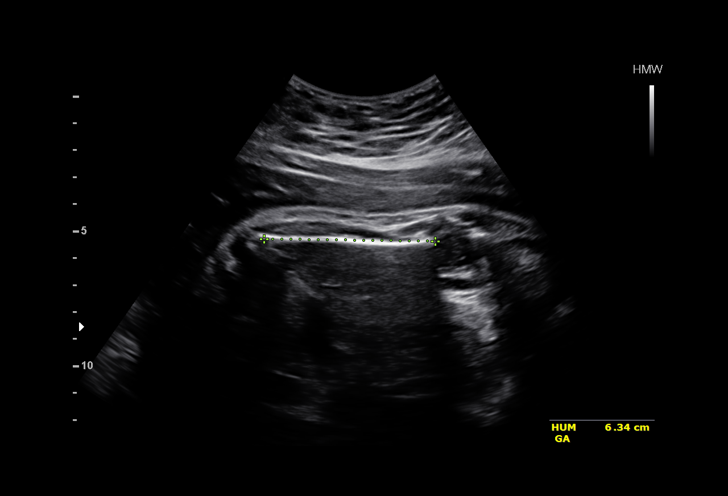
[im 13/39]
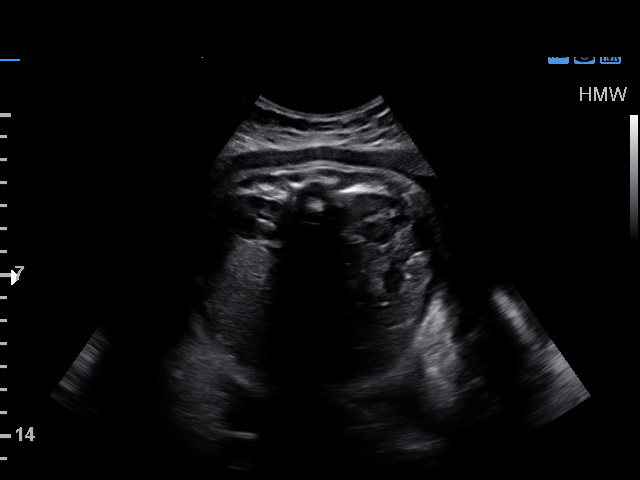
[im 16/39]
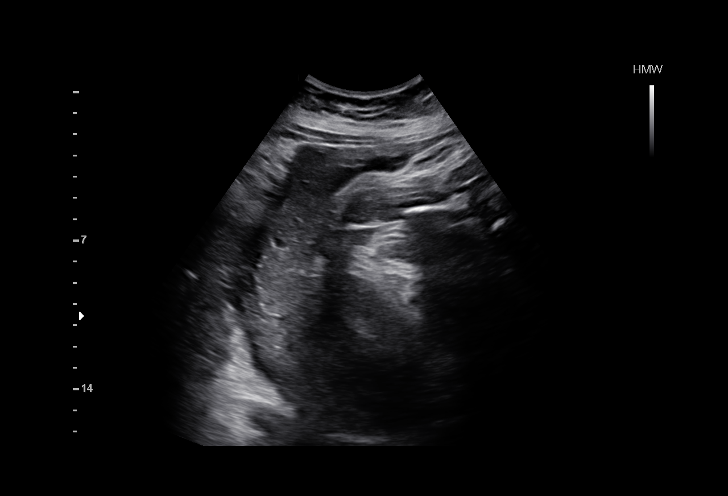
[im 19/39]
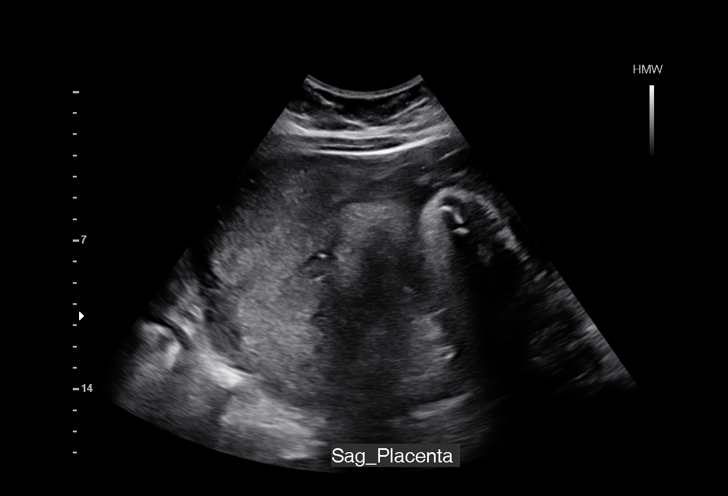
[im 22/39]
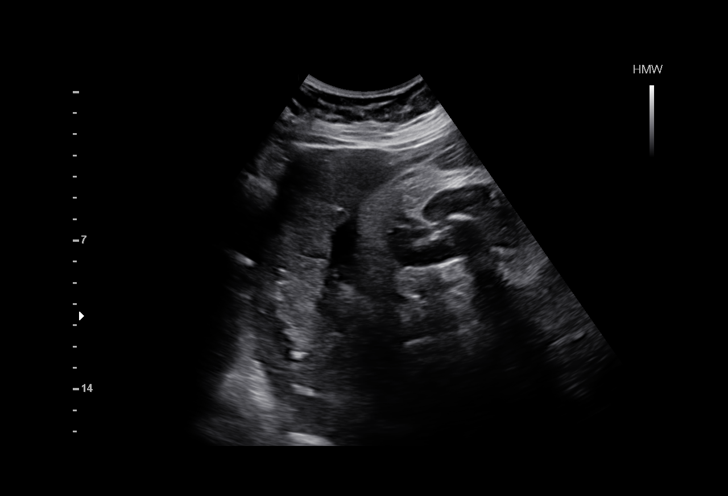
[im 24/39]
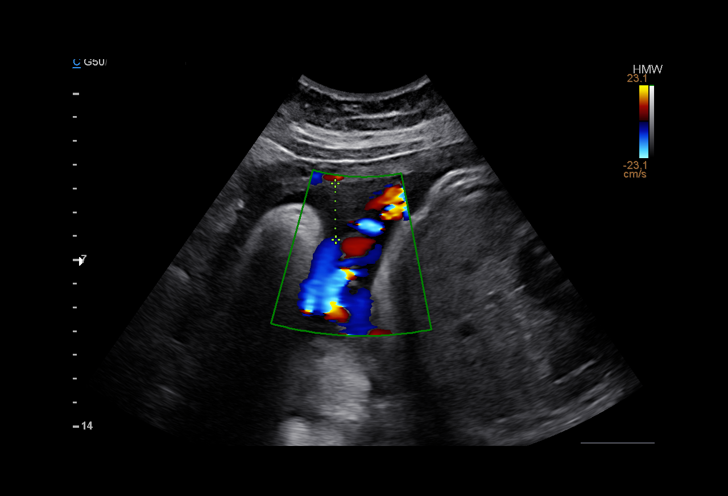
[im 27/39]
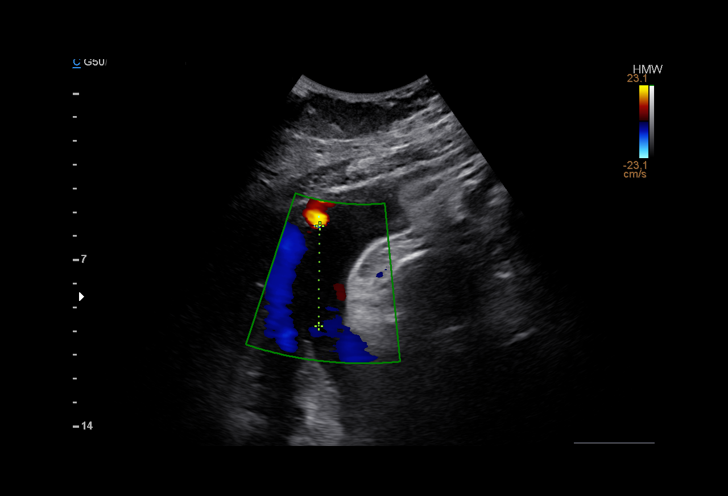
[im 30/39]
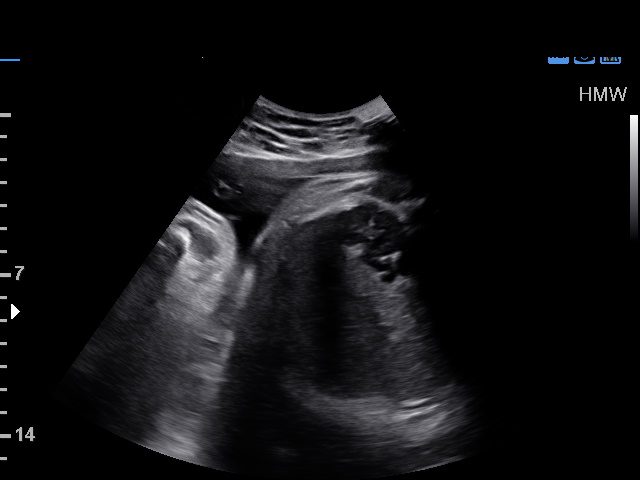
[im 33/39]
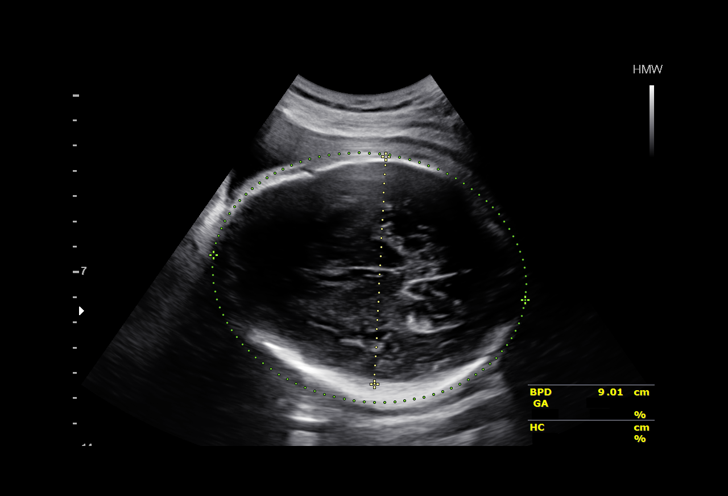
[im 36/39]
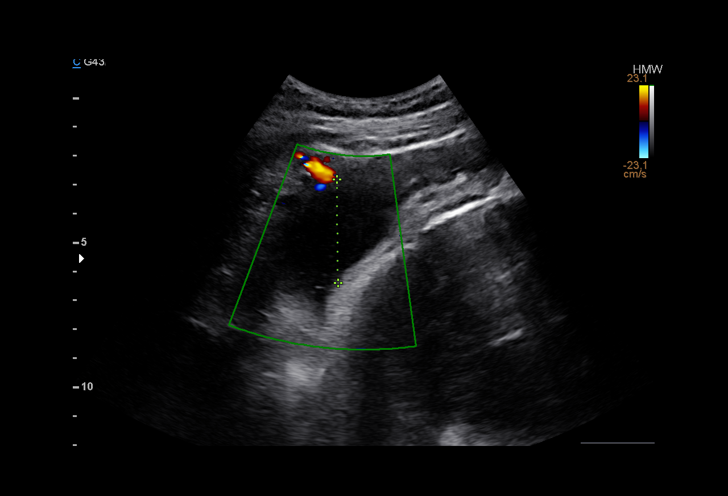
[im 39/39]
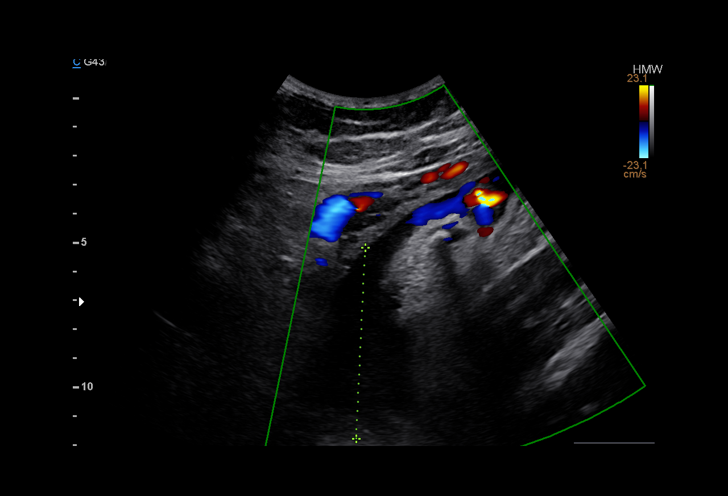

[14 of 28 positions shown; findings below may reference images not displayed]

Road [HOSPITAL]

2  KEMALCAN ISPIR            [id number]      [id number]     [id number]
Indications

38 weeks gestation of pregnancy
Hypertension - Chronic/Pre-existing on
labetalol
Echogenic bowel (first trimester, ? SCH) -
declined CF screen, TORCH titers
Obesity complicating pregnancy, third
trimester
Advanced maternal age multigravida 35+,
third trimester - Tiger/Alesita Astete, declined genetic
testing
OB History

Gravidity:    4         Term:   0        Prem:   0        SAB:   2
TOP:          1       Ectopic:  0        Living: 0
Fetal Evaluation

Num Of Fetuses:     1
Fetal Heart         153
Rate(bpm):
Cardiac Activity:   Observed
Presentation:       Cephalic
Placenta:           Posterior Fundal, above cervical os
P. Cord Insertion:  Previously Visualized

Amniotic Fluid
AFI FV:      Subjectively within normal limits
AFI Sum:     16.97   cm       66  %Tile     Larg Pckt:    6.78  cm
RUQ:   3.58    cm   RLQ:    6.61   cm    LUQ:   0       cm   LLQ:    6.78   cm
Biophysical Evaluation

Amniotic F.V:   Within normal limits       F. Tone:        Observed
F. Movement:    Observed                   Score:          [DATE]
F. Breathing:   Observed
Biometry

BPD:      91.3  mm     G. Age:  37w 0d                  CI:        69.16   %    70 - 86
FL/HC:      21.6   %    20.9 -
HC:      350.6  mm     G. Age:  40w 6d         88  %    HC/AC:      0.99        0.92 -
AC:      353.4  mm     G. Age:  39w 2d         90  %    FL/BPD:     82.8   %    71 - 87
FL:       75.6  mm     G. Age:  38w 5d         66  %    FL/AC:      21.4   %    20 - 24
HUM:      64.1  mm     G. Age:  37w 1d         59  %

Est. FW:    5530  gm      8 lb 1 oz   > 90  %
Gestational Age

LMP:           38w 1d        Date:  03/05/15                 EDD:   12/10/15
U/S Today:     39w 0d                                        EDD:   12/04/15
Best:          38w 1d     Det. By:  LMP  (03/05/15)          EDD:   12/10/15
Anatomy

Cranium:          Appears normal         Aortic Arch:      Previously seen
Fetal Cavum:      Previously seen        Ductal Arch:      Previously seen
Ventricles:       Appears normal         Diaphragm:        Previously seen
Choroid Plexus:   Previously seen        Stomach:          Appears normal, left
sided
Cerebellum:       Previously seen        Abdomen:          Previously seen
Posterior Fossa:  Previously seen        Abdominal Wall:   Previously seen
Nuchal Fold:      Not applicable (>20    Cord Vessels:     Previously seen
wks GA)
Face:             Orbits and profile     Kidneys:          Appear normal
previously seen
Lips:             Previously seen        Bladder:          Appears normal
Palate:           Previously seen        Spine:            Previously seen
Heart:            Previously seen        Upper             Previously seen
Extremities:
RVOT:             Previously seen        Lower             Previously seen
Extremities:
LVOT:             Previously seen

Other:  Female gender previously seen. Heels and 5th digit previously seen.
Technically difficult due to maternal habitus and fetal position.
Cervix Uterus Adnexa

Cervix
Not visualized (advanced GA >82wks)

Uterus
No abnormality visualized.

Left Ovary
Not visualized.

Right Ovary
Not visualized.
Cul De Sac:   No free fluid seen.
Adnexa:       No abnormality visualized.
Impression

Singleton intrauterine pregnancy at 38 weeks 1 day gestation
with fetal cardiac activity
Cephalic presentation
Posterior placenta without evidence of previa
Estimated growth at >90th centile for stated gestational age
BPP [DATE] with an AFI >16 cm
Recommendations

Continue twice weekly NSTs with weekly AFIs or weekly BPPs
Induction scheduled for 39 weeks

## 2017-04-29 ENCOUNTER — Other Ambulatory Visit: Payer: Self-pay | Admitting: Obstetrics

## 2017-05-12 ENCOUNTER — Ambulatory Visit (INDEPENDENT_AMBULATORY_CARE_PROVIDER_SITE_OTHER): Payer: BC Managed Care – PPO | Admitting: Certified Nurse Midwife

## 2017-05-12 ENCOUNTER — Encounter: Payer: Self-pay | Admitting: Certified Nurse Midwife

## 2017-05-12 VITALS — BP 136/90 | HR 88 | Wt 245.6 lb

## 2017-05-12 DIAGNOSIS — Z01419 Encounter for gynecological examination (general) (routine) without abnormal findings: Secondary | ICD-10-CM | POA: Diagnosis not present

## 2017-05-12 DIAGNOSIS — N898 Other specified noninflammatory disorders of vagina: Secondary | ICD-10-CM

## 2017-05-12 DIAGNOSIS — Z124 Encounter for screening for malignant neoplasm of cervix: Secondary | ICD-10-CM

## 2017-05-12 DIAGNOSIS — Z803 Family history of malignant neoplasm of breast: Secondary | ICD-10-CM

## 2017-05-12 DIAGNOSIS — Z1151 Encounter for screening for human papillomavirus (HPV): Secondary | ICD-10-CM | POA: Diagnosis not present

## 2017-05-12 DIAGNOSIS — I10 Essential (primary) hypertension: Secondary | ICD-10-CM

## 2017-05-12 DIAGNOSIS — Z113 Encounter for screening for infections with a predominantly sexual mode of transmission: Secondary | ICD-10-CM | POA: Diagnosis not present

## 2017-05-12 DIAGNOSIS — Z3041 Encounter for surveillance of contraceptive pills: Secondary | ICD-10-CM

## 2017-05-12 MED ORDER — TRIAMTERENE-HCTZ 37.5-25 MG PO TABS: 1.0000 | ORAL_TABLET | Freq: Every day | ORAL | 2 refills | Status: DC

## 2017-05-12 MED ORDER — NORETHINDRONE 0.35 MG PO TABS: 1.0000 | ORAL_TABLET | Freq: Every day | ORAL | 4 refills | Status: DC

## 2017-05-12 NOTE — Progress Notes (Signed)
Patient is in the office for annual exam today. Patient reports no problem today.

## 2017-05-12 NOTE — Addendum Note (Signed)
Addended by: Elby BeckPAUL, Chet Greenley F on: 05/12/2017 11:48 AM   Modules accepted: Orders

## 2017-05-12 NOTE — Progress Notes (Signed)
Subjective:        Mackenzie Moreno is a 41 y.o. female here for a routine exam.  Current complaints: vaginal discharge.  States that she ate Congochinese food last night.  Does have a family history of breast cancer: mom, maternal aunts 64X2.   Desires BRACA screening.  Had an early normal mammogram around 41 years of age.  Does have a female sexual partner, uses [employer].    Personal health questionnaire:  Is patient Ashkenazi Jewish, have a family history of breast and/or ovarian cancer: yes Is there a family history of uterine cancer diagnosed at age < 4850, gastrointestinal cancer, urinary tract cancer, family member who is a Personnel officerLynch syndrome-associated carrier: no Is the patient overweight and hypertensive, family history of diabetes, personal history of gestational diabetes, preeclampsia or PCOS: yes Is patient over 6555, have PCOS,  family history of premature CHD under age 39, diabetes, smoke, have hypertension or peripheral artery disease:  yes At any time, has a partner hit, kicked or otherwise hurt or frightened you?: no Over the past 2 weeks, have you felt down, depressed or hopeless?: no Over the past 2 weeks, have you felt little interest or pleasure in doing things?:no   Gynecologic History Patient's last menstrual period was 04/23/2017 (exact date). Contraception: abstinence and oral progesterone-only contraceptive Last Pap: 05/22/15. Results were: normal Last mammogram: 03/20/15. Results were: normal, recommendation to start mammograms at 42  Obstetric History OB History  Gravida Para Term Preterm AB Living  4 1 1  0 3 1  SAB TAB Ectopic Multiple Live Births  2 1 0 0 1    # Outcome Date GA Lbr Len/2nd Weight Sex Delivery Anes PTL Lv  4 Term 12/05/15 [id number] 05:22 / 01:15 8 lb 13.6 oz (4.014 kg) F Vag-Spont EPI  LIV  3 SAB           2 SAB           1 TAB               Past Medical History:  Diagnosis Date  . Anemia   . Hypertension   . Missed ab     Past Surgical History:   Procedure Laterality Date  . CHOLECYSTECTOMY  2005  . CHOLECYSTECTOMY    . DILATION AND EVACUATION  05/18/2012   Procedure: DILATATION AND EVACUATION;  Surgeon: Annamaria BootsMary Suzanne Miller, MD;  Location: [facility];  Service: Gynecology;  Laterality: N/A;  . TONSILLECTOMY  2007  . WISDOM TOOTH EXTRACTION       Current Outpatient Prescriptions:  .  ibuprofen (ADVIL,MOTRIN) 600 MG tablet, TAKE ONE TABLET BY MOUTH EVERY 6 HOURS AS NEEDED FOR  MILD  PAIN, Disp: 30 tablet, Rfl: 5 .  norethindrone (MICRONOR,CAMILA,ERRIN) 0.35 MG tablet, Take 1 tablet (0.35 mg total) by mouth daily., Disp: 140 tablet, Rfl: 4 .  triamterene-hydrochlorothiazide (MAXZIDE-25) 37.5-25 MG tablet, Take 1 tablet by mouth daily., Disp: 30 tablet, Rfl: 2 Allergies  Allergen Reactions  . Vicodin [Hydrocodone-Acetaminophen] Rash    Social History  Substance Use Topics  . Smoking status: Former Games developermoker  . Smokeless tobacco: Never Used  . Alcohol use 0.0 oz/week     Comment: social    Family History  Problem Relation Age of Onset  . Cancer Mother   . COPD Mother   . Hypertension Mother   . Heart disease Mother   . Heart disease Father   . Hypertension Brother       Review of Systems  Constitutional: negative for fatigue and weight loss Respiratory: negative for cough and wheezing Cardiovascular: negative for chest pain, fatigue and palpitations Gastrointestinal: negative for abdominal pain and change in bowel habits Musculoskeletal:negative for myalgias Neurological: negative for gait problems and tremors Behavioral/Psych: negative for abusive relationship, depression Endocrine: negative for temperature intolerance    Genitourinary:negative for abnormal menstrual periods, genital lesions, hot flashes, sexual problems and + vaginal discharge Integument/breast: negative for breast lump, breast tenderness, nipple discharge and skin lesion(s)    Objective:       BP 136/90   Pulse 88   Wt 245 lb 9.6 oz (111.4 kg)    LMP 04/23/2017 (Exact Date)   BMI 36.27 kg/m  General:   alert  Skin:   no rash or abnormalities  Lungs:   clear to auscultation bilaterally  Heart:   regular rate and rhythm, S1, S2 normal, no murmur, click, rub or gallop  Breasts:   normal without suspicious masses, skin or nipple changes or axillary nodes  Abdomen:  normal findings: no organomegaly, soft, non-tender and no hernia  Pelvis:  External genitalia: normal general appearance Urinary system: urethral meatus normal and bladder without fullness, nontender Vaginal: normal without tenderness, induration or masses, + white vaginal discharge noted.  Cervix: normal appearance, slightly friable Adnexa: normal bimanual exam Uterus: anteverted and non-tender, normal size   Lab Review Urine pregnancy test Labs reviewed yes Radiologic studies reviewed yes  50% of 30 min visit spent on counseling and coordination of care.    Assessment:    Healthy female exam.    Plan:    Education reviewed: calcium supplements, depression evaluation, low fat, low cholesterol diet, safe sex/STD prevention, self breast exams, skin cancer screening and weight bearing exercise. Contraception: abstinence and oral progesterone-only contraceptive. Follow up in: 1 year.   Meds ordered this encounter  Medications  . norethindrone (MICRONOR,CAMILA,ERRIN) 0.35 MG tablet    Sig: Take 1 tablet (0.35 mg total) by mouth daily.    Dispense:  140 tablet    Refill:  4  . triamterene-hydrochlorothiazide (MAXZIDE-25) 37.5-25 MG tablet    Sig: Take 1 tablet by mouth daily.    Dispense:  30 tablet    Refill:  2   Orders Placed This Encounter  Procedures  . BRCAssure Comprehensive Test  . RPR  . Hepatitis C antibody  . Hepatitis B surface antigen  . HIV antibody    Possible management options include: LARK Follow up as needed.

## 2017-05-13 LAB — RPR: RPR Ser Ql: NONREACTIVE

## 2017-05-13 LAB — HEPATITIS B SURFACE ANTIGEN: Hepatitis B Surface Ag: NEGATIVE

## 2017-05-13 LAB — HEPATITIS C ANTIBODY

## 2017-05-13 LAB — HIV ANTIBODY (ROUTINE TESTING W REFLEX): HIV Screen 4th Generation wRfx: NONREACTIVE

## 2017-05-15 LAB — CERVICOVAGINAL ANCILLARY ONLY
BACTERIAL VAGINITIS: POSITIVE — AB
CANDIDA VAGINITIS: NEGATIVE
CHLAMYDIA, DNA PROBE: NEGATIVE
Neisseria Gonorrhea: NEGATIVE
Trichomonas: NEGATIVE

## 2017-05-16 ENCOUNTER — Other Ambulatory Visit: Payer: Self-pay | Admitting: Certified Nurse Midwife

## 2017-05-16 ENCOUNTER — Encounter: Payer: Self-pay | Admitting: Certified Nurse Midwife

## 2017-05-16 DIAGNOSIS — N760 Acute vaginitis: Principal | ICD-10-CM

## 2017-05-16 DIAGNOSIS — B9689 Other specified bacterial agents as the cause of diseases classified elsewhere: Secondary | ICD-10-CM

## 2017-05-16 LAB — CYTOLOGY - PAP
Diagnosis: NEGATIVE
HPV 16/18/45 genotyping: NEGATIVE
HPV: DETECTED — AB

## 2017-05-16 MED ORDER — METRONIDAZOLE 500 MG PO TABS
500.0000 mg | ORAL_TABLET | Freq: Two times a day (BID) | ORAL | 0 refills | Status: DC
Start: 2017-05-16 — End: 2023-03-13

## 2017-08-21 ENCOUNTER — Other Ambulatory Visit: Payer: Self-pay | Admitting: Certified Nurse Midwife

## 2017-08-21 DIAGNOSIS — I10 Essential (primary) hypertension: Secondary | ICD-10-CM

## 2017-12-30 ENCOUNTER — Other Ambulatory Visit: Payer: Self-pay | Admitting: Obstetrics

## 2017-12-30 DIAGNOSIS — I10 Essential (primary) hypertension: Secondary | ICD-10-CM

## 2018-03-12 ENCOUNTER — Other Ambulatory Visit: Payer: Self-pay | Admitting: Obstetrics

## 2018-04-18 ENCOUNTER — Other Ambulatory Visit: Payer: Self-pay | Admitting: Obstetrics

## 2018-04-18 DIAGNOSIS — I10 Essential (primary) hypertension: Secondary | ICD-10-CM

## 2018-04-19 ENCOUNTER — Other Ambulatory Visit: Payer: Self-pay | Admitting: Nurse Practitioner

## 2018-04-19 DIAGNOSIS — Z139 Encounter for screening, unspecified: Secondary | ICD-10-CM

## 2018-05-21 ENCOUNTER — Other Ambulatory Visit: Payer: Self-pay | Admitting: Certified Nurse Midwife

## 2018-05-21 DIAGNOSIS — Z3041 Encounter for surveillance of contraceptive pills: Secondary | ICD-10-CM

## 2018-05-21 NOTE — Telephone Encounter (Signed)
Please review for review.

## 2018-05-21 NOTE — Telephone Encounter (Signed)
Please review for refill.  

## 2018-06-01 ENCOUNTER — Ambulatory Visit
Admission: RE | Admit: 2018-06-01 | Discharge: 2018-06-01 | Disposition: A | Discharge status: Home / Self Care | Payer: BC Managed Care – PPO | Source: Ambulatory Visit | Attending: Nurse Practitioner | Admitting: Nurse Practitioner

## 2018-06-01 DIAGNOSIS — Z139 Encounter for screening, unspecified: Secondary | ICD-10-CM

## 2019-05-22 ENCOUNTER — Other Ambulatory Visit: Payer: Self-pay | Admitting: Obstetrics

## 2019-05-24 ENCOUNTER — Other Ambulatory Visit: Payer: Self-pay | Admitting: Obstetrics

## 2019-05-24 DIAGNOSIS — I10 Essential (primary) hypertension: Secondary | ICD-10-CM

## 2019-06-04 ENCOUNTER — Other Ambulatory Visit: Payer: Self-pay | Admitting: Physician Assistant

## 2019-06-04 DIAGNOSIS — Z1231 Encounter for screening mammogram for malignant neoplasm of breast: Secondary | ICD-10-CM

## 2019-06-27 ENCOUNTER — Ambulatory Visit (INDEPENDENT_AMBULATORY_CARE_PROVIDER_SITE_OTHER): Payer: BC Managed Care – PPO | Admitting: Certified Nurse Midwife

## 2019-06-27 ENCOUNTER — Other Ambulatory Visit: Payer: Self-pay

## 2019-06-27 ENCOUNTER — Encounter: Payer: Self-pay | Admitting: Certified Nurse Midwife

## 2019-06-27 VITALS — BP 140/98 | HR 89 | Temp 99.0°F | Ht 69.0 in | Wt 262.0 lb

## 2019-06-27 DIAGNOSIS — Z113 Encounter for screening for infections with a predominantly sexual mode of transmission: Secondary | ICD-10-CM

## 2019-06-27 DIAGNOSIS — Z01419 Encounter for gynecological examination (general) (routine) without abnormal findings: Secondary | ICD-10-CM | POA: Diagnosis not present

## 2019-06-27 DIAGNOSIS — M5431 Sciatica, right side: Secondary | ICD-10-CM

## 2019-06-27 DIAGNOSIS — N760 Acute vaginitis: Secondary | ICD-10-CM | POA: Diagnosis not present

## 2019-06-27 DIAGNOSIS — Z124 Encounter for screening for malignant neoplasm of cervix: Secondary | ICD-10-CM | POA: Diagnosis not present

## 2019-06-27 DIAGNOSIS — Z3041 Encounter for surveillance of contraceptive pills: Secondary | ICD-10-CM

## 2019-06-27 DIAGNOSIS — B9689 Other specified bacterial agents as the cause of diseases classified elsewhere: Secondary | ICD-10-CM

## 2019-06-27 DIAGNOSIS — N898 Other specified noninflammatory disorders of vagina: Secondary | ICD-10-CM

## 2019-06-27 DIAGNOSIS — I10 Essential (primary) hypertension: Secondary | ICD-10-CM

## 2019-06-27 MED ORDER — NORETHINDRONE 0.35 MG PO TABS: 1.0000 | ORAL_TABLET | Freq: Every day | ORAL | 3 refills | Status: DC

## 2019-06-27 MED ORDER — IBUPROFEN 600 MG PO TABS: ORAL_TABLET | ORAL | 3 refills | Status: AC

## 2019-06-27 NOTE — Patient Instructions (Addendum)
Managing Your Hypertension Hypertension is commonly called high blood pressure. This is when the force of your blood pressing against the walls of your arteries is too strong. Arteries are blood vessels that carry blood from your heart throughout your body. Hypertension forces the heart to work harder to pump blood, and may cause the arteries to become narrow or stiff. Having untreated or uncontrolled hypertension can cause heart attack, stroke, kidney disease, and other problems. What are blood pressure readings? A blood pressure reading consists of a higher number over a lower number. Ideally, your blood pressure should be below 120/80. The first ("top") number is called the systolic pressure. It is a measure of the pressure in your arteries as your heart beats. The second ("bottom") number is called the diastolic pressure. It is a measure of the pressure in your arteries as the heart relaxes. What does my blood pressure reading mean? Blood pressure is classified into four stages. Based on your blood pressure reading, your health care provider may use the following stages to determine what type of treatment you need, if any. Systolic pressure and diastolic pressure are measured in a unit called mm Hg. Normal  Systolic pressure: below 078.  Diastolic pressure: below 80. Elevated  Systolic pressure: 675-449.  Diastolic pressure: below 80. Hypertension stage 1  Systolic pressure: 201-007.  Diastolic pressure: 12-19. Hypertension stage 2  Systolic pressure: 758 or above.  Diastolic pressure: 90 or above. What health risks are associated with hypertension? Managing your hypertension is an important responsibility. Uncontrolled hypertension can lead to:  A heart attack.  A stroke.  A weakened blood vessel (aneurysm).  Heart failure.  Kidney damage.  Eye damage.  Metabolic syndrome.  Memory and concentration problems. What changes can I make to manage my  hypertension? Hypertension can be managed by making lifestyle changes and possibly by taking medicines. Your health care provider will help you make a plan to bring your blood pressure within a normal range. Eating and drinking   Eat a diet that is high in fiber and potassium, and low in salt (sodium), added sugar, and fat. An example eating plan is called the DASH (Dietary Approaches to Stop Hypertension) diet. To eat this way: ? Eat plenty of fresh fruits and vegetables. Try to fill half of your plate at each meal with fruits and vegetables. ? Eat whole grains, such as whole wheat pasta, brown rice, or whole grain bread. Fill about one quarter of your plate with whole grains. ? Eat low-fat diary products. ? Avoid fatty cuts of meat, processed or cured meats, and poultry with skin. Fill about one quarter of your plate with lean proteins such as fish, chicken without skin, beans, eggs, and tofu. ? Avoid premade and processed foods. These tend to be higher in sodium, added sugar, and fat.  Reduce your daily sodium intake. Most people with hypertension should eat less than 1,500 mg of sodium a day.  Limit alcohol intake to no more than 1 drink a day for nonpregnant women and 2 drinks a day for men. One drink equals 12 oz of beer, 5 oz of wine, or 1 oz of hard liquor. Lifestyle  Work with your health care provider to maintain a healthy body weight, or to lose weight. Ask what an ideal weight is for you.  Get at least 30 minutes of exercise that causes your heart to beat faster (aerobic exercise) most days of the week. Activities may include walking, swimming, or biking.  Include exercise  to strengthen your muscles (resistance exercise), such as weight lifting, as part of your weekly exercise routine. Try to do these types of exercises for 30 minutes at least 3 days a week.  Do not use any products that contain nicotine or tobacco, such as cigarettes and e-cigarettes. If you need help quitting,  ask your health care provider.  Control any long-term (chronic) conditions you have, such as high cholesterol or diabetes. Monitoring  Monitor your blood pressure at home as told by your health care provider. Your personal target blood pressure may vary depending on your medical conditions, your age, and other factors.  Have your blood pressure checked regularly, as often as told by your health care provider. Working with your health care provider  Review all the medicines you take with your health care provider because there may be side effects or interactions.  Talk with your health care provider about your diet, exercise habits, and other lifestyle factors that may be contributing to hypertension.  Visit your health care provider regularly. Your health care provider can help you create and adjust your plan for managing hypertension. Will I need medicine to control my blood pressure? Your health care provider may prescribe medicine if lifestyle changes are not enough to get your blood pressure under control, and if:  Your systolic blood pressure is 130 or higher.  Your diastolic blood pressure is 80 or higher. Take medicines only as told by your health care provider. Follow the directions carefully. Blood pressure medicines must be taken as prescribed. The medicine does not work as well when you skip doses. Skipping doses also puts you at risk for problems. Contact a health care provider if:  You think you are having a reaction to medicines you have taken.  You have repeated (recurrent) headaches.  You feel dizzy.  You have swelling in your ankles.  You have trouble with your vision. Get help right away if:  You develop a severe headache or confusion.  You have unusual weakness or numbness, or you feel faint.  You have severe pain in your chest or abdomen.  You vomit repeatedly.  You have trouble breathing. Summary  Hypertension is when the force of blood pumping  through your arteries is too strong. If this condition is not controlled, it may put you at risk for serious complications.  Your personal target blood pressure may vary depending on your medical conditions, your age, and other factors. For most people, a normal blood pressure is less than 120/80.  Hypertension is managed by lifestyle changes, medicines, or both. Lifestyle changes include weight loss, eating a healthy, low-sodium diet, exercising more, and limiting alcohol. This information is not intended to replace advice given to you by your health care provider. Make sure you discuss any questions you have with your health care provider. Document Released: 08/01/2012 Document Revised: 03/01/2019 Document Reviewed: 10/05/2016 Elsevier Patient Education  2020 [employer]  Sciatica Rehab Ask your health care provider which exercises are safe for you. Do exercises exactly as told by your health care provider and adjust them as directed. It is normal to feel mild stretching, pulling, tightness, or discomfort as you do these exercises. Stop right away if you feel sudden pain or your pain gets worse. Do not begin these exercises until told by your health care provider. Stretching and range-of-motion exercises These exercises warm up your muscles and joints and improve the movement and flexibility of your hips and back. These exercises also help to relieve pain,  numbness, and tingling. Sciatic nerve glide 1. Sit in a chair with your head facing down toward your chest. Place your hands behind your back. Let your shoulders slump forward. 2. Slowly straighten one of your legs while you tilt your head back as if you are looking toward the ceiling. Only straighten your leg as far as you can without making your symptoms worse. 3. Hold this position for __________ seconds. 4. Slowly return to the starting position. 5. Repeat with your other leg. Repeat __________ times. Complete this exercise __________  times a day. Knee to chest with hip adduction and internal rotation  1. Lie on your back on a firm surface with both legs straight. 2. Bend one of your knees and move it up toward your chest until you feel a gentle stretch in your lower back and buttock. Then, move your knee toward the shoulder that is on the opposite side from your leg. This is hip adduction and internal rotation. ? Hold your leg in this position by holding on to the front of your knee. 3. Hold this position for __________ seconds. 4. Slowly return to the starting position. 5. Repeat with your other leg. Repeat __________ times. Complete this exercise __________ times a day. Prone extension on elbows  1. Lie on your abdomen on a firm surface. A bed may be too soft for this exercise. 2. Prop yourself up on your elbows. 3. Use your arms to help lift your chest up until you feel a gentle stretch in your abdomen and your lower back. ? This will place some of your body weight on your elbows. If this is uncomfortable, try stacking pillows under your chest. ? Your hips should stay down, against the surface that you are lying on. Keep your hip and back muscles relaxed. 4. Hold this position for __________ seconds. 5. Slowly relax your upper body and return to the starting position. Repeat __________ times. Complete this exercise __________ times a day. Strengthening exercises These exercises build strength and endurance in your back. Endurance is the ability to use your muscles for a long time, even after they get tired. Pelvic tilt This exercise strengthens the muscles that lie deep in the abdomen. 1. Lie on your back on a firm surface. Bend your knees and keep your feet flat on the floor. 2. Tense your abdominal muscles. Tip your pelvis up toward the ceiling and flatten your lower back into the floor. ? To help with this exercise, you may place a small towel under your lower back and try to push your back into the  towel. 3. Hold this position for __________ seconds. 4. Let your muscles relax completely before you repeat this exercise. Repeat __________ times. Complete this exercise __________ times a day. Alternating arm and leg raises  1. Get on your hands and knees on a firm surface. If you are on a hard floor, you may want to use padding, such as an exercise mat, to cushion your knees. 2. Line up your arms and legs. Your hands should be directly below your shoulders, and your knees should be directly below your hips. 3. Lift your left leg behind you. At the same time, raise your right arm and straighten it in front of you. ? Do not lift your leg higher than your hip. ? Do not lift your arm higher than your shoulder. ? Keep your abdominal and back muscles tight. ? Keep your hips facing the ground. ? Do not arch your back. ?  Keep your balance carefully, and do not hold your breath. 4. Hold this position for __________ seconds. 5. Slowly return to the starting position. 6. Repeat with your right leg and your left arm. Repeat __________ times. Complete this exercise __________ times a day. Posture and body mechanics Good posture and healthy body mechanics can help to relieve stress in your body's tissues and joints. Body mechanics refers to the movements and positions of your body while you do your daily activities. Posture is part of body mechanics. Good posture means:  Your spine is in its natural S-curve position (neutral).  Your shoulders are pulled back slightly.  Your head is not tipped forward. Follow these guidelines to improve your posture and body mechanics in your everyday activities. Standing   When standing, keep your spine neutral and your feet about hip width apart. Keep a slight bend in your knees. Your ears, shoulders, and hips should line up.  When you do a task in which you stand in one place for a long time, place one foot up on a stable object that is 2-4 inches (5-10 cm)  high, such as a footstool. This helps keep your spine neutral. Sitting   When sitting, keep your spine neutral and keep your feet flat on the floor. Use a footrest, if necessary, and keep your thighs parallel to the floor. Avoid rounding your shoulders, and avoid tilting your head forward.  When working at a desk or a computer, keep your desk at a height where your hands are slightly lower than your elbows. Slide your chair under your desk so you are close enough to maintain good posture.  When working at a computer, place your monitor at a height where you are looking straight ahead and you do not have to tilt your head forward or downward to look at the screen. Resting  When lying down and resting, avoid positions that are most painful for you.  If you have pain with activities such as sitting, bending, stooping, or squatting, lie in a position in which your body does not bend very much. For example, avoid curling up on your side with your arms and knees near your chest (fetal position).  If you have pain with activities such as standing for a long time or reaching with your arms, lie with your spine in a neutral position and bend your knees slightly. Try the following positions: ? Lying on your side with a pillow between your knees. ? Lying on your back with a pillow under your knees. Lifting   When lifting objects, keep your feet at least shoulder width apart and tighten your abdominal muscles.  Bend your knees and hips and keep your spine neutral. It is important to lift using the strength of your legs, not your back. Do not lock your knees straight out.  Always ask for help to lift heavy or awkward objects. This information is not intended to replace advice given to you by your health care provider. Make sure you discuss any questions you have with your health care provider. Document Released: 11/07/2005 Document Revised: 03/01/2019 Document Reviewed: 11/29/2018 Elsevier Patient  Education  2020 [employer]

## 2019-06-27 NOTE — Progress Notes (Signed)
GYNECOLOGY ANNUAL PREVENTATIVE CARE ENCOUNTER NOTE  History:     Mackenzie Moreno is a 43 y.o. [phone] female here for a routine annual gynecologic exam.  Current complaints: occasional pain and tingling that starts in back and shoots down right leg. Also wants to discussed BP medication to see if the diuretic portion can be increased to help with bloating and swelling. Denies abnormal vaginal bleeding, discharge, pelvic pain, problems with intercourse or other gynecologic concerns. She request full STD screening at this time.    Gynecologic History Patient's last menstrual period was 05/26/2019 (exact date). Contraception: oral progesterone-only contraceptive Last Pap: 2018. Results were: abnormal with positive HPV Last mammogram: 2019. Results were: normal  Obstetric History OB History  Gravida Para Term Preterm AB Living  4 1 1  0 3 1  SAB TAB Ectopic Multiple Live Births  2 1 0 0 1    # Outcome Date GA Lbr Len/2nd Weight Sex Delivery Anes PTL Lv  4 Term 12/05/15 [id number] 05:22 / 01:15 8 lb 13.6 oz (4.014 kg) F Vag-Spont EPI  LIV  3 SAB           2 SAB           1 TAB             Past Medical History:  Diagnosis Date  . Anemia   . Hypertension   . Missed ab     Past Surgical History:  Procedure Laterality Date  . CHOLECYSTECTOMY  2005  . CHOLECYSTECTOMY    . DILATION AND EVACUATION  05/18/2012   Procedure: DILATATION AND EVACUATION;  Surgeon: Annamaria BootsMary Suzanne Miller, MD;  Location: [facility];  Service: Gynecology;  Laterality: N/A;  . TONSILLECTOMY  2007  . WISDOM TOOTH EXTRACTION      Current Outpatient Medications on File Prior to Visit  Medication Sig Dispense Refill  . triamterene-hydrochlorothiazide (MAXZIDE-25) 37.5-25 MG tablet Take 1 tablet by mouth once daily 90 tablet 0  . metroNIDAZOLE (FLAGYL) 500 MG tablet Take 1 tablet (500 mg total) by mouth 2 (two) times daily. (Patient not taking: Reported on 06/27/2019) 14 tablet 0   No current facility-administered  medications on file prior to visit.     Allergies  Allergen Reactions  . Vicodin [Hydrocodone-Acetaminophen] Rash    Social History:  reports that she has quit smoking. She has never used smokeless tobacco. She reports current alcohol use. She reports that she does not use drugs.  Family History  Problem Relation Age of Onset  . Cancer Mother   . COPD Mother   . Hypertension Mother   . Heart disease Mother   . Breast cancer Mother   . Heart disease Father   . Hypertension Brother     The following portions of the patient's history were reviewed and updated as appropriate: allergies, current medications, past family history, past medical history, past social history, past surgical history and problem list.  Review of Systems Pertinent items noted in HPI and remainder of comprehensive ROS otherwise negative.  Physical Exam:  BP (!) 140/98   Pulse 89   Temp 99 F (37.2 C) (Oral)   Ht 5\' 9"  (1.753 m)   Wt 262 lb (118.8 kg)   LMP 05/26/2019 (Exact Date)   BMI 38.69 kg/m  CONSTITUTIONAL: Well-developed, obese female in no acute distress.  HENT:  Normocephalic, atraumatic, External right and left ear normal. Oropharynx is clear and moist EYES: Conjunctivae and EOM are normal. Pupils are equal, round, and  reactive to light.  NECK: Normal range of motion, supple, no masses.  Normal thyroid.  SKIN: Skin is warm and dry. No rash noted. Not diaphoretic. No erythema. No pallor. MUSCULOSKELETAL: Normal range of motion. No tenderness.  No cyanosis, clubbing, or edema.  2+ distal pulses. NEUROLOGIC: Alert and oriented to person, place, and time. Normal reflexes, muscle tone coordination. No cranial nerve deficit noted. PSYCHIATRIC: Normal mood and affect. Normal behavior. Normal judgment and thought content. CARDIOVASCULAR: Normal heart rate noted, regular rhythm RESPIRATORY: Clear to auscultation bilaterally. Effort and breath sounds normal, no problems with respiration noted. BREASTS:  Symmetric in size. No masses, skin changes, nipple drainage, or lymphadenopathy. ABDOMEN: Soft, normal bowel sounds, no distention noted.  No tenderness, rebound or guarding.  PELVIC: Normal appearing external genitalia; normal appearing vaginal mucosa and cervix.  No abnormal discharge noted.  Pap smear obtained.  Normal uterine size, no other palpable masses, no uterine or adnexal tenderness.   Assessment and Plan:    1. Women's annual routine gynecological examination - Normal well woman examination  - Cytology - PAP( [place]) - ibuprofen (ADVIL) 600 MG tablet; TAKE 1 TABLET BY MOUTH EVERY 6 HOURS AS NEEDED FOR MILD PAIN  Dispense: 30 tablet; Refill: 3  2. Chronic hypertension - Currently on Maxzide 25 - Managed by PCP, instructed to discuss medication changes with PCP  3. Screening for STDs (sexually transmitted diseases) - Patient request STD screening today  - Hepatitis B Surface AntiGEN - Hepatitis C Antibody - HIV Antibody (routine testing w rflx) - RPR - Cervicovaginal ancillary only( [place])  4. Encounter for surveillance of contraceptive pills - norethindrone (MICRONOR) 0.35 MG tablet; Take 1 tablet (0.35 mg total) by mouth daily.  Dispense: 3 Package; Refill: 3  5. Sciatic nerve pain, right - Educated and discussed sciatic nerve pain  - Discussed increasing amount of exercising and stretching specific for sciatic nerve, information given in AVS   Will follow up results of pap smear and manage accordingly. Mammogram scheduled Routine preventative health maintenance measures emphasized. Please refer to After Visit Summary for other counseling recommendations.      Lajean Manes, [place] for [employer], [place]

## 2019-06-27 NOTE — Progress Notes (Signed)
Patient presents for her Annual Exam today.  CC: pt notes bloating at times. *pt wants to discuss B/P medication   Last pap: 05/12/2017 +HPV  Mammogram : Scheduled for 08/27 or 08/28  Last Mammogram 06/01/2018  Needs Refill on IBP   STD Screening : Full Panel   LMP: 05/26/19  Contraception: Pills

## 2019-06-28 LAB — HIV ANTIBODY (ROUTINE TESTING W REFLEX): HIV Screen 4th Generation wRfx: NONREACTIVE

## 2019-06-28 LAB — HEPATITIS C ANTIBODY: Hep C Virus Ab: <0.1 s/co ratio (ref 0.0–0.9)

## 2019-06-28 LAB — RPR: RPR Ser Ql: NONREACTIVE

## 2019-06-28 LAB — HEPATITIS B SURFACE ANTIGEN: Hepatitis B Surface Ag: NEGATIVE

## 2019-07-01 LAB — CYTOLOGY - PAP
Chlamydia: NEGATIVE
Diagnosis: NEGATIVE
HPV: NOT DETECTED
Neisseria Gonorrhea: NEGATIVE

## 2019-07-01 LAB — CERVICOVAGINAL ANCILLARY ONLY: Candida vaginitis: NEGATIVE

## 2019-07-18 ENCOUNTER — Ambulatory Visit
Admission: RE | Admit: 2019-07-18 | Discharge: 2019-07-18 | Disposition: A | Discharge status: Home / Self Care | Payer: BC Managed Care – PPO | Source: Ambulatory Visit | Attending: Physician Assistant | Admitting: Physician Assistant

## 2019-07-18 ENCOUNTER — Other Ambulatory Visit: Payer: Self-pay

## 2019-07-18 DIAGNOSIS — Z1231 Encounter for screening mammogram for malignant neoplasm of breast: Secondary | ICD-10-CM

## 2019-09-02 ENCOUNTER — Other Ambulatory Visit: Payer: Self-pay | Admitting: Obstetrics

## 2019-09-02 DIAGNOSIS — I10 Essential (primary) hypertension: Secondary | ICD-10-CM

## 2019-11-22 DIAGNOSIS — G500 Trigeminal neuralgia: Secondary | ICD-10-CM

## 2019-11-22 HISTORY — DX: Trigeminal neuralgia: G50.0

## 2020-01-30 ENCOUNTER — Ambulatory Visit: Payer: BC Managed Care – PPO | Attending: Family

## 2020-01-30 DIAGNOSIS — Z23 Encounter for immunization: Secondary | ICD-10-CM

## 2020-01-30 NOTE — Progress Notes (Signed)
   Covid-19 Vaccination Clinic  Name:  Mackenzie Moreno    MRN: [id number] DOB: [birth year]  01/30/2020  [name] was observed post Covid-19 immunization for 15 minutes without incident. She was provided with Vaccine Information Sheet and instruction to access the V-Safe system.   [name] was instructed to call 911 with any severe reactions post vaccine: Marland Kitchen Difficulty breathing  . Swelling of face and throat  . A fast heartbeat  . A bad rash all over body  . Dizziness and weakness   Immunizations Administered    Name Date Dose VIS Date Route   Moderna COVID-19 Vaccine 01/30/2020  4:04 PM 0.5 mL 10/22/2019 Intramuscular   Manufacturer: Moderna   Lot: [id number]   NDC: 07225-750-51

## 2020-03-03 ENCOUNTER — Ambulatory Visit: Payer: BC Managed Care – PPO

## 2020-03-10 ENCOUNTER — Ambulatory Visit: Payer: BC Managed Care – PPO | Attending: Family

## 2020-03-10 DIAGNOSIS — Z23 Encounter for immunization: Secondary | ICD-10-CM

## 2020-03-10 NOTE — Progress Notes (Signed)
   Covid-19 Vaccination Clinic  Name:  TANJA GIFT    MRN: [id number] DOB: July 30, 1976  03/10/2020  Ms. Gillum was observed post Covid-19 immunization for 15 minutes without incident. She was provided with Vaccine Information Sheet and instruction to access the V-Safe system.   Ms. Zamarripa was instructed to call 911 with any severe reactions post vaccine: Marland Kitchen Difficulty breathing  . Swelling of face and throat  . A fast heartbeat  . A bad rash all over body  . Dizziness and weakness   Immunizations Administered    Name Date Dose VIS Date Route   Moderna COVID-19 Vaccine 03/10/2020  3:48 PM 0.5 mL 10/2019 Intramuscular   Manufacturer: Moderna   Lot: [id number]   NDC: 91660-600-45

## 2020-05-25 ENCOUNTER — Other Ambulatory Visit: Payer: Self-pay | Admitting: Certified Nurse Midwife

## 2020-05-25 DIAGNOSIS — Z3041 Encounter for surveillance of contraceptive pills: Secondary | ICD-10-CM

## 2020-05-28 ENCOUNTER — Telehealth: Payer: Self-pay

## 2020-05-28 DIAGNOSIS — Z3041 Encounter for surveillance of contraceptive pills: Secondary | ICD-10-CM

## 2020-05-28 NOTE — Telephone Encounter (Signed)
Returned call and sent [facility] refill per protocol

## 2020-08-19 ENCOUNTER — Other Ambulatory Visit: Payer: Self-pay | Admitting: Certified Nurse Midwife

## 2020-08-19 DIAGNOSIS — Z3041 Encounter for surveillance of contraceptive pills: Secondary | ICD-10-CM

## 2020-09-23 ENCOUNTER — Ambulatory Visit: Payer: BC Managed Care – PPO | Attending: Family

## 2020-09-23 DIAGNOSIS — Z23 Encounter for immunization: Secondary | ICD-10-CM

## 2020-12-05 NOTE — Progress Notes (Signed)
   Covid-19 Vaccination Clinic  Name:  Mackenzie Moreno    MRN: [id number] DOB: [birth year]  12/05/2020  Mackenzie Moreno was observed post Covid-19 immunization for 15 minutes without incident. She was provided with Vaccine Information Sheet and instruction to access the V-Safe system.   Mackenzie Moreno was instructed to call 911 with any severe reactions post vaccine: Marland Kitchen Difficulty breathing  . Swelling of face and throat  . A fast heartbeat  . A bad rash all over body  . Dizziness and weakness   Immunizations Administered    Name Date Dose VIS Date Route   Moderna Covid-19 Booster Vaccine 09/23/2020  5:55 PM 0.25 mL 09/09/2020 Intramuscular   Manufacturer: Moderna   Lot: [id number]   NDC: 38466-599-35

## 2021-01-19 ENCOUNTER — Other Ambulatory Visit: Payer: Self-pay | Admitting: Physician Assistant

## 2021-01-19 DIAGNOSIS — Z1231 Encounter for screening mammogram for malignant neoplasm of breast: Secondary | ICD-10-CM

## 2021-03-11 ENCOUNTER — Ambulatory Visit: Payer: BC Managed Care – PPO

## 2021-03-23 ENCOUNTER — Other Ambulatory Visit: Payer: Self-pay

## 2021-03-23 ENCOUNTER — Ambulatory Visit
Admission: RE | Admit: 2021-03-23 | Discharge: 2021-03-23 | Disposition: A | Discharge status: Home / Self Care | Payer: BC Managed Care – PPO | Source: Ambulatory Visit | Attending: Physician Assistant | Admitting: Physician Assistant

## 2021-03-23 DIAGNOSIS — Z1231 Encounter for screening mammogram for malignant neoplasm of breast: Secondary | ICD-10-CM

## 2021-07-04 ENCOUNTER — Ambulatory Visit (INDEPENDENT_AMBULATORY_CARE_PROVIDER_SITE_OTHER): Payer: BC Managed Care – PPO

## 2021-07-04 ENCOUNTER — Encounter (HOSPITAL_COMMUNITY): Payer: Self-pay

## 2021-07-04 ENCOUNTER — Ambulatory Visit (HOSPITAL_COMMUNITY)
Admission: EM | Admit: 2021-07-04 | Discharge: 2021-07-04 | Disposition: A | Discharge status: Home / Self Care | Payer: BC Managed Care – PPO | Attending: Physician Assistant | Admitting: Physician Assistant

## 2021-07-04 DIAGNOSIS — S8992XA Unspecified injury of left lower leg, initial encounter: Secondary | ICD-10-CM | POA: Diagnosis not present

## 2021-07-04 DIAGNOSIS — W19XXXA Unspecified fall, initial encounter: Secondary | ICD-10-CM

## 2021-07-04 DIAGNOSIS — M79675 Pain in left toe(s): Secondary | ICD-10-CM

## 2021-07-04 DIAGNOSIS — M25562 Pain in left knee: Secondary | ICD-10-CM

## 2021-07-04 NOTE — ED Provider Notes (Signed)
[facility]    CSN: [id number] Arrival date & time: 07/04/21  1238      History   Chief Complaint Chief Complaint  Patient presents with   Fall   Knee Injury    HPI Mackenzie Moreno is a 45 y.o. female.   Patient presents today with a several day history of worsening left knee and foot pain following injury.  Reports that she was at a gas station on Friday (07/02/2021) when she went to move away from the counter and slipped on some water that she did not was there falling onto her knees with the majority of her weight falling onto her left side.  She denies any head injury or loss of consciousness associated with this fall.  She was evaluated by EMS and sent home with instruction to take over-the-counter medications for symptom relief.  She has been taking ibuprofen with minimal improvement of symptoms.  Over the past several days she has had worsening swelling and pain particularly in her left knee and her left great toe.  Pain is rated 1/2 at rest but increases to 5/6 with attempted ambulation, described as aching periodic sharp pains, worse with attempted ambulation, no relieving factors identified.  She denies previous injury or surgery to knee or foot.  She denies any weakness, numbness, paresthesias.  Denies any popping, clicking, instability.   Past Medical History:  Diagnosis Date   Anemia    Hypertension    Missed ab     Patient Active Problem List   Diagnosis Date Noted   High blood pressure 05/22/2015    Past Surgical History:  Procedure Laterality Date   CHOLECYSTECTOMY  2005   CHOLECYSTECTOMY     DILATION AND EVACUATION  05/18/2012   Procedure: DILATATION AND EVACUATION;  Surgeon: Annamaria Boots, MD;  Location: [facility];  Service: Gynecology;  Laterality: N/A;   TONSILLECTOMY  2007   WISDOM TOOTH EXTRACTION      OB History     Gravida  4   Para  1   Term  1   Preterm  0   AB  3   Living  1      SAB  2   IAB  1   Ectopic  0    Multiple  0   Live Births  1            Home Medications    Prior to Admission medications   Medication Sig Start Date End Date Taking? Authorizing Provider  ibuprofen (ADVIL) 600 MG tablet TAKE 1 TABLET BY MOUTH EVERY 6 HOURS AS NEEDED FOR MILD PAIN 06/27/19   Sharyon Cable, CNM  metroNIDAZOLE (FLAGYL) 500 MG tablet Take 1 tablet (500 mg total) by mouth 2 (two) times daily. Patient not taking: Reported on 06/27/2019 05/16/17   Roe Coombs, CNM  norethindrone (MICRONOR) 0.35 MG tablet Take 1 tablet by mouth once daily 05/28/20   Sharyon Cable, CNM  triamterene-hydrochlorothiazide [facility]) 37.5-25 MG tablet Take 1 tablet by mouth once daily 05/27/19   Brock Bad, MD    Family History Family History  Problem Relation Age of Onset   Cancer Mother    COPD Mother    Hypertension Mother    Heart disease Mother    Breast cancer Mother    Heart disease Father    Hypertension Brother     Social History Social History   Tobacco Use   Smoking status: Former   Smokeless tobacco:  Never  Substance Use Topics   Alcohol use: Yes    Alcohol/week: 0.0 standard drinks    Comment: social   Drug use: No     Allergies   Vicodin [hydrocodone-acetaminophen]   Review of Systems Review of Systems  Constitutional:  Positive for activity change. Negative for appetite change, fatigue and fever.  Eyes:  Negative for photophobia and visual disturbance.  Respiratory:  Negative for cough and shortness of breath.   Cardiovascular:  Negative for chest pain.  Gastrointestinal:  Negative for abdominal pain, diarrhea, nausea and vomiting.  Musculoskeletal:  Positive for arthralgias, gait problem and joint swelling. Negative for myalgias.  Neurological:  Negative for dizziness, weakness, light-headedness, numbness and headaches.    Physical Exam Triage Vital Signs ED Triage Vitals  Enc Vitals Group     BP 07/04/21 1402 (!) 147/98     Pulse Rate 07/04/21 1401 80      Resp 07/04/21 1401 20     Temp 07/04/21 1402 98.1 F (36.7 C)     Temp Source 07/04/21 1401 Oral     SpO2 07/04/21 1401 97 %     Weight --      Height --      Head Circumference --      Peak Flow --      Pain Score 07/04/21 1400 5     Pain Loc --      Pain Edu? --      Excl. in GC? --    No data found.  Updated Vital Signs BP (!) 147/98   Pulse 80   Temp 98.1 F (36.7 C) (Oral)   Resp 20   LMP 06/09/2021 (Exact Date)   SpO2 97%   Visual Acuity Right Eye Distance:   Left Eye Distance:   Bilateral Distance:    Right Eye Near:   Left Eye Near:    Bilateral Near:     Physical Exam Vitals reviewed.  Constitutional:      General: She is awake. She is not in acute distress.    Appearance: Normal appearance. She is not ill-appearing.     Comments: Very pleasant female appears stated age no acute distress sitting comfortably in exam room  HENT:     Head: Normocephalic and atraumatic.  Cardiovascular:     Rate and Rhythm: Normal rate and regular rhythm.     Pulses:          Radial pulses are 1+ on the right side and 1+ on the left side.     Heart sounds: Normal heart sounds, S1 normal and S2 normal. No murmur heard.    Comments: Capillary refill within 2 seconds left toes Pulmonary:     Effort: Pulmonary effort is normal.     Breath sounds: Normal breath sounds. No wheezing, rhonchi or rales.     Comments: Clear to auscultation bilaterally Abdominal:     Palpations: Abdomen is soft.     Tenderness: There is no abdominal tenderness.  Musculoskeletal:     Left knee: Swelling and ecchymosis present. No effusion. Decreased range of motion. Tenderness present over the medial joint line. No LCL laxity, MCL laxity, ACL laxity or PCL laxity.    Instability Tests: Anterior drawer test negative. Posterior drawer test negative.     Right lower leg: No edema.     Left lower leg: No edema.     Left foot: Normal range of motion. No deformity or bunion.     Comments: Left knee:  Moderate bruising over anterior left knee.  Mild swelling without effusion noted.  Decreased range of motion with flexion.  Tenderness palpation over inferior joint line.  No deformity noted.  No ligamentous laxity on exam.  Left foot: Foot neurovascularly intact.  Tenderness palpation over interphalangeal joint of left great toe.  No significant swelling or deformity noted.  Feet:     Left foot:     Protective Sensation: 10 sites tested.  10 sites sensed.     Skin integrity: Callus present. No ulcer, blister or skin breakdown.  Psychiatric:        Behavior: Behavior is cooperative.     UC Treatments / Results  Labs (all labs ordered are listed, but only abnormal results are displayed) Labs Reviewed - No data to display  EKG   Radiology DG Knee Complete 4 Views Left  Result Date: 07/04/2021 CLINICAL DATA:  Pain and swelling after a fall. EXAM: LEFT KNEE - COMPLETE 4+ VIEW COMPARISON:  None. FINDINGS: No evidence of fracture, dislocation, or joint effusion. A superior patellar enthesophyte is noted. Soft tissues are unremarkable. IMPRESSION: No acute osseous injury. Electronically Signed   By: Romona Curls M.D.   On: 07/04/2021 14:56   DG Toe Great Left  Result Date: 07/04/2021 CLINICAL DATA:  Pain and swelling after a fall. EXAM: LEFT GREAT TOE COMPARISON:  None. FINDINGS: There is no evidence of fracture or dislocation. There is no evidence of arthropathy or other focal bone abnormality. Soft tissues are unremarkable. IMPRESSION: Negative. Electronically Signed   By: Romona Curls M.D.   On: 07/04/2021 14:56    Procedures Procedures (including critical care time)  Medications Ordered in UC Medications - No data to display  Initial Impression / Assessment and Plan / UC Course  I have reviewed the triage vital signs and the nursing notes.  Pertinent labs & imaging results that were available during my care of the patient were reviewed by me and considered in my medical decision  making (see chart for details).      X-rays obtained given mechanism of injury and bony tenderness showed no acute osseous abnormality.  Patient was placed in postop shoe and given Ace bandage for left knee to help manage her symptoms.  She has ibuprofen 600 mg available at home and was encouraged to take this on a regular basis to help with pain and inflammation.  Discussed conservative treatment measures including RICE protocol.  Patient was given contact information for sports medicine provider should symptoms persist despite conservative treatment.  Discussed alarm symptoms that warrant emergent evaluation.  Strict return precautions given to which patient expressed understanding.  Final Clinical Impressions(s) / UC Diagnoses   Final diagnoses:  Fall, initial encounter  Acute pain of left knee  Injury of left knee, initial encounter  Great toe pain, left     Discharge Instructions      Take ibuprofen every 6 hours to help with pain and inflammation. Follow the RICE protocol for additional symptom relief including rest, ice, compression, elevation.  Use Tylenol for additional pain relief.  If your symptoms or not improving within a few days with conservative treatment I recommend you follow-up with sports medicine provider as we discussed.     ED Prescriptions   None    PDMP not reviewed this encounter.   Jeani Hawking, PA-C 07/04/21 1507

## 2021-07-04 NOTE — Discharge Instructions (Addendum)
Take ibuprofen every 6 hours to help with pain and inflammation. Follow the RICE protocol for additional symptom relief including rest, ice, compression, elevation.  Use Tylenol for additional pain relief.  If your symptoms or not improving within a few days with conservative treatment I recommend you follow-up with sports medicine provider as we discussed.

## 2021-07-04 NOTE — ED Triage Notes (Signed)
Pt reports falling on Friday night and states she fell on her left knee. States her knee has been sore and states her toe on the left toe has been hard to bend and apply pressure.

## 2021-07-14 ENCOUNTER — Ambulatory Visit: Payer: BC Managed Care – PPO | Admitting: Family Medicine

## 2021-07-21 ENCOUNTER — Ambulatory Visit: Payer: BC Managed Care – PPO | Admitting: Family Medicine

## 2022-08-11 ENCOUNTER — Other Ambulatory Visit: Payer: Self-pay | Admitting: Physician Assistant

## 2022-08-11 DIAGNOSIS — Z1231 Encounter for screening mammogram for malignant neoplasm of breast: Secondary | ICD-10-CM

## 2022-08-31 ENCOUNTER — Ambulatory Visit (INDEPENDENT_AMBULATORY_CARE_PROVIDER_SITE_OTHER): Payer: BC Managed Care – PPO

## 2022-08-31 ENCOUNTER — Other Ambulatory Visit: Payer: Self-pay | Admitting: Podiatry

## 2022-08-31 ENCOUNTER — Ambulatory Visit: Payer: BC Managed Care – PPO | Admitting: Podiatry

## 2022-08-31 ENCOUNTER — Encounter: Payer: Self-pay | Admitting: Podiatry

## 2022-08-31 DIAGNOSIS — M722 Plantar fascial fibromatosis: Secondary | ICD-10-CM

## 2022-08-31 DIAGNOSIS — M79671 Pain in right foot: Secondary | ICD-10-CM

## 2022-08-31 DIAGNOSIS — M205X1 Other deformities of toe(s) (acquired), right foot: Secondary | ICD-10-CM | POA: Diagnosis not present

## 2022-08-31 DIAGNOSIS — M2041 Other hammer toe(s) (acquired), right foot: Secondary | ICD-10-CM

## 2022-08-31 DIAGNOSIS — M2042 Other hammer toe(s) (acquired), left foot: Secondary | ICD-10-CM

## 2022-08-31 MED ORDER — TRIAMCINOLONE ACETONIDE 10 MG/ML IJ SUSP
10.0000 mg | Freq: Once | INTRAMUSCULAR | Status: AC
  Administered 2022-08-31: 10 mg

## 2022-08-31 MED ORDER — DICLOFENAC SODIUM 75 MG PO TBEC: 75.0000 mg | DELAYED_RELEASE_TABLET | Freq: Two times a day (BID) | ORAL | 2 refills | Status: DC

## 2022-08-31 NOTE — Progress Notes (Signed)
Subjective:   Patient ID: Mackenzie Moreno, female   DOB: 46 y.o.   MRN: [id number]   HPI Patient states she has had a lot of pain in her right heel for at least 3 months she has painful corns on the fifth toes of both feet and underneath the left foot and has pain in the big toe joint right that is been going on for a long time and getting worse.  Patient does not smoke moderately obese likes to be active   Review of Systems  All other systems reviewed and are negative.       Objective:  Physical Exam Vitals and nursing note reviewed.  Constitutional:      Appearance: She is well-developed.  Pulmonary:     Effort: Pulmonary effort is normal.  Musculoskeletal:        General: Normal range of motion.  Skin:    General: Skin is warm.  Neurological:     Mental Status: She is alert.     Neurovascular status intact muscle strength was found to be adequate range of motion adequate.  Patient does have exquisite discomfort plantar aspect right heel at the insertional point of the tendon and calcaneus with inflammation fluid around the medial band and is noted to have reduced range of motion first MPJ right with pain in the joint and keratotic lesion digit 5 right over left along with some of the fifth metatarsal head left.  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  Today I did sterile prep injected the plantar fascia right 3 mg Kenalog 5 mg Xylocaine applied fascial brace gave instructions for physical therapy supportive therapy and placed on oral diclofenac and reappoint 2  weeks to reevaluate  X-rays indicate small spur plantar heel and did indicate spurring of the first MPJ right moderate narrowing of the joint surface with rotated fifth digit bilateral     Plan:  Everything is in above note

## 2022-08-31 NOTE — Patient Instructions (Signed)

## 2022-09-13 ENCOUNTER — Ambulatory Visit
Admission: RE | Admit: 2022-09-13 | Discharge: 2022-09-13 | Disposition: A | Discharge status: Home / Self Care | Payer: BC Managed Care – PPO | Source: Ambulatory Visit | Attending: Physician Assistant | Admitting: Physician Assistant

## 2022-09-13 DIAGNOSIS — Z1231 Encounter for screening mammogram for malignant neoplasm of breast: Secondary | ICD-10-CM

## 2022-09-16 ENCOUNTER — Encounter: Payer: Self-pay | Admitting: Podiatry

## 2022-09-16 ENCOUNTER — Ambulatory Visit: Payer: BC Managed Care – PPO | Admitting: Podiatry

## 2022-09-16 DIAGNOSIS — M2042 Other hammer toe(s) (acquired), left foot: Secondary | ICD-10-CM | POA: Diagnosis not present

## 2022-09-16 DIAGNOSIS — M722 Plantar fascial fibromatosis: Secondary | ICD-10-CM | POA: Diagnosis not present

## 2022-09-16 DIAGNOSIS — M205X1 Other deformities of toe(s) (acquired), right foot: Secondary | ICD-10-CM | POA: Diagnosis not present

## 2022-09-16 DIAGNOSIS — M2041 Other hammer toe(s) (acquired), right foot: Secondary | ICD-10-CM

## 2022-09-16 NOTE — Progress Notes (Signed)
Subjective:   Patient ID: Mackenzie Moreno, female   DOB: 46 y.o.   MRN: [id number]   HPI Patient presents stating her heel was doing well but it started to come back again she has chronic problems with both of them and then pain in the outside of the foot.  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  I do think that ultimate surgery for the digital deformities bunion right is necessary and she wants to get it done and we may decide to do it later this year and at this point today I went ahead and I casted for functional orthotics to try to reduce the plantar pressure she has had chronic Planter fasciitis for a number of years.  All questions answered patient to be seen back when orthotics are ready

## 2022-10-05 ENCOUNTER — Telehealth: Payer: Self-pay | Admitting: Podiatry

## 2022-10-05 NOTE — Telephone Encounter (Signed)
Lmom to call back to schedule picking up orthotics   No balance 

## 2022-10-11 ENCOUNTER — Ambulatory Visit (INDEPENDENT_AMBULATORY_CARE_PROVIDER_SITE_OTHER): Payer: Self-pay

## 2022-10-11 DIAGNOSIS — M722 Plantar fascial fibromatosis: Secondary | ICD-10-CM

## 2022-10-11 NOTE — Progress Notes (Signed)
Patient presents today to pick up custom molded foot orthotics, diagnosed with plantar fasciitis by Dr. Charlsie Merles.   Orthotics were dispensed and fit was satisfactory. Reviewed instructions for break-in and wear. Written instructions given to patient.  Patient will follow up as needed.   [facility] - order # [id number]

## 2023-03-13 ENCOUNTER — Ambulatory Visit (HOSPITAL_COMMUNITY)
Admission: EM | Admit: 2023-03-13 | Discharge: 2023-03-13 | Disposition: A | Discharge status: Home / Self Care | Payer: BC Managed Care – PPO | Attending: Family Medicine | Admitting: Family Medicine

## 2023-03-13 ENCOUNTER — Encounter (HOSPITAL_COMMUNITY): Payer: Self-pay | Admitting: Emergency Medicine

## 2023-03-13 DIAGNOSIS — J029 Acute pharyngitis, unspecified: Secondary | ICD-10-CM

## 2023-03-13 LAB — POCT RAPID STREP A (OFFICE): Rapid Strep A Screen: NEGATIVE

## 2023-03-13 NOTE — Discharge Instructions (Signed)
You were seen today for sore throat.  Your rapid strep was negative.  We will send this for culture and if positive you will be notified for treatment.  In the mean time this appears viral.  I recommend you try tylenol or motrin for pain, as well as salt water gargles.   If not improving, or worsening by end of the week then please return for re-evaluation.

## 2023-03-13 NOTE — ED Provider Notes (Signed)
[facility]    CSN: [id number] Arrival date & time: 03/13/23  4098      History   Chief Complaint Chief Complaint  Patient presents with   Sore Throat    HPI Mackenzie Moreno is a 47 y.o. female.   Patient is here for sore throat.  Started 2 days ago.  Worsened yesterday with bilateral neck/ear pain.  Yesterday she thought she saw something in the back of her throat.  Does not have tonsils.  No runny nose, congestion.  Some chills yesterday, but no known fever.  Slight headache, slight fatigue.  No known sick contacts.  No otc meds used for pain.       Past Medical History:  Diagnosis Date   Anemia    Hypertension    Missed ab     Patient Active Problem List   Diagnosis Date Noted   High blood pressure 05/22/2015    Past Surgical History:  Procedure Laterality Date   CHOLECYSTECTOMY  2005   CHOLECYSTECTOMY     DILATION AND EVACUATION  05/18/2012   Procedure: DILATATION AND EVACUATION;  Surgeon: Annamaria Boots, MD;  Location: [facility];  Service: Gynecology;  Laterality: N/A;   TONSILLECTOMY  2007   WISDOM TOOTH EXTRACTION      OB History     Gravida  4   Para  1   Term  1   Preterm  0   AB  3   Living  1      SAB  2   IAB  1   Ectopic  0   Multiple  0   Live Births  1            Home Medications    Prior to Admission medications   Medication Sig Start Date End Date Taking? Authorizing Provider  cyclobenzaprine (FLEXERIL) 5 MG tablet Take 5 mg by mouth 3 (three) times daily as needed for muscle spasms. 02/22/23  Yes [provider]  atorvastatin (LIPITOR) 20 MG tablet Take by mouth. 04/28/21   [provider]  carbamazepine (CARBATROL) 200 MG 12 hr capsule Take by mouth. 09/13/21   [provider]  diclofenac (VOLTAREN) 75 MG EC tablet Take 1 tablet (75 mg total) by mouth 2 (two) times daily. 08/31/22   Lenn Sink, DPM  gabapentin (NEURONTIN) 600 MG tablet Take 600 mg by mouth 3 (three)  times daily. 08/23/22   [provider]  ibuprofen (ADVIL) 600 MG tablet TAKE 1 TABLET BY MOUTH EVERY 6 HOURS AS NEEDED FOR MILD PAIN 06/27/19   Sharyon Cable, CNM  metroNIDAZOLE (FLAGYL) 500 MG tablet Take 1 tablet (500 mg total) by mouth 2 (two) times daily. Patient not taking: Reported on 06/27/2019 05/16/17   Roe Coombs, CNM  norethindrone (MICRONOR) 0.35 MG tablet Take 1 tablet by mouth once daily 05/28/20   Sharyon Cable, CNM  triamterene-hydrochlorothiazide [facility]) 37.5-25 MG tablet Take 1 tablet by mouth once daily 05/27/19   Brock Bad, MD    Family History Family History  Problem Relation Age of Onset   Cancer Mother    COPD Mother    Hypertension Mother    Heart disease Mother    Breast cancer Mother    Heart disease Father    Hypertension Brother     Social History Social History   Tobacco Use   Smoking status: Former   Smokeless tobacco: Never  Substance Use Topics   Alcohol use: Yes  Alcohol/week: 0.0 standard drinks of alcohol    Comment: social   Drug use: No     Allergies   Vicodin [hydrocodone-acetaminophen]   Review of Systems Review of Systems  Constitutional:  Positive for chills. Negative for fatigue.  HENT:  Positive for sore throat. Negative for congestion and rhinorrhea.   Respiratory: Negative.    Cardiovascular: Negative.   Gastrointestinal: Negative.      Physical Exam Triage Vital Signs ED Triage Vitals  Enc Vitals Group     BP 03/13/23 0846 (!) 148/96     Pulse Rate 03/13/23 0846 85     Resp 03/13/23 0846 17     Temp 03/13/23 0846 98.5 F (36.9 C)     Temp Source 03/13/23 0846 Oral     SpO2 03/13/23 0846 97 %     Weight --      Height --      Head Circumference --      Peak Flow --      Pain Score 03/13/23 0844 6     Pain Loc --      Pain Edu? --      Excl. in GC? --    No data found.  Updated Vital Signs BP (!) 148/96 (BP Location: Left Arm)   Pulse 85   Temp 98.5 F (36.9 C) (Oral)    Resp 17   LMP 03/09/2023   SpO2 97%   Visual Acuity Right Eye Distance:   Left Eye Distance:   Bilateral Distance:    Right Eye Near:   Left Eye Near:    Bilateral Near:     Physical Exam Constitutional:      Appearance: She is well-developed.  HENT:     Right Ear: Tympanic membrane normal.     Left Ear: Tympanic membrane normal.     Nose: No congestion or rhinorrhea.     Mouth/Throat:     Mouth: Mucous membranes are moist.     Pharynx: Oropharyngeal exudate and posterior oropharyngeal erythema present. No pharyngeal swelling.     Tonsils: No tonsillar exudate.  Neck:     Comments: + tenderness Cardiovascular:     Rate and Rhythm: Normal rate and regular rhythm.  Musculoskeletal:     Cervical back: Normal range of motion and neck supple.  Lymphadenopathy:     Cervical: No cervical adenopathy.  Skin:    General: Skin is warm.  Neurological:     General: No focal deficit present.     Mental Status: She is alert.  Psychiatric:        Mood and Affect: Mood normal.      UC Treatments / Results  Labs (all labs ordered are listed, but only abnormal results are displayed) Labs Reviewed  CULTURE, GROUP A STREP [facility])  POCT RAPID STREP A (OFFICE)    EKG   Radiology No results found.  Procedures Procedures (including critical care time)  Medications Ordered in UC Medications - No data to display  Initial Impression / Assessment and Plan / UC Course  I have reviewed the triage vital signs and the nursing notes.  Pertinent labs & imaging results that were available during my care of the patient were reviewed by me and considered in my medical decision making (see chart for details).  Final Clinical Impressions(s) / UC Diagnoses   Final diagnoses:  Pharyngitis, unspecified etiology     Discharge Instructions      You were seen today for sore throat.  Your rapid  strep was negative.  We will send this for culture and if positive you will be notified  for treatment.  In the mean time this appears viral.  I recommend you try tylenol or motrin for pain, as well as salt water gargles.   If not improving, or worsening by end of the week then please return for re-evaluation.     ED Prescriptions   None    PDMP not reviewed this encounter.   Jannifer Franklin, MD 03/13/23 [phone]

## 2023-03-13 NOTE — ED Triage Notes (Signed)
Had sore throat since Saturday. Reports doesn't have tonsils but saw something in back of throat. Tried teas to soothe. Reports neck feels sore

## 2023-03-14 LAB — CULTURE, GROUP A STREP (THRC)

## 2023-03-15 ENCOUNTER — Telehealth (HOSPITAL_COMMUNITY): Payer: Self-pay | Admitting: Emergency Medicine

## 2023-03-15 MED ORDER — AMOXICILLIN 500 MG PO CAPS: 500.0000 mg | ORAL_CAPSULE | Freq: Two times a day (BID) | ORAL | 0 refills | Status: AC

## 2023-03-21 ENCOUNTER — Other Ambulatory Visit (HOSPITAL_COMMUNITY)
Admission: RE | Admit: 2023-03-21 | Discharge: 2023-03-21 | Disposition: A | Discharge status: Home / Self Care | Payer: BC Managed Care – PPO | Source: Ambulatory Visit | Attending: Certified Nurse Midwife | Admitting: Certified Nurse Midwife

## 2023-03-21 ENCOUNTER — Ambulatory Visit (INDEPENDENT_AMBULATORY_CARE_PROVIDER_SITE_OTHER): Payer: BC Managed Care – PPO | Admitting: Certified Nurse Midwife

## 2023-03-21 ENCOUNTER — Encounter: Payer: Self-pay | Admitting: Certified Nurse Midwife

## 2023-03-21 VITALS — BP 151/104 | HR 83 | Ht 69.0 in | Wt 234.6 lb

## 2023-03-21 DIAGNOSIS — E041 Nontoxic single thyroid nodule: Secondary | ICD-10-CM | POA: Diagnosis not present

## 2023-03-21 DIAGNOSIS — I10 Essential (primary) hypertension: Secondary | ICD-10-CM

## 2023-03-21 DIAGNOSIS — Z7689 Persons encountering health services in other specified circumstances: Secondary | ICD-10-CM

## 2023-03-21 DIAGNOSIS — R232 Flushing: Secondary | ICD-10-CM | POA: Diagnosis present

## 2023-03-21 DIAGNOSIS — K649 Unspecified hemorrhoids: Secondary | ICD-10-CM | POA: Insufficient documentation

## 2023-03-21 DIAGNOSIS — Z0000 Encounter for general adult medical examination without abnormal findings: Secondary | ICD-10-CM | POA: Diagnosis not present

## 2023-03-21 NOTE — Progress Notes (Signed)
NGYN presents to establish care Last PAP 06-27-2019 Mammogram 09-14-22 Requesting STD testing  Pt reports recurring hemorrhoids Pt currently using antibiotic, given diflucan for yeast.

## 2023-03-22 LAB — HEPATITIS B SURFACE ANTIGEN: Hepatitis B Surface Ag: NEGATIVE

## 2023-03-22 LAB — THYROID PANEL WITH TSH
Free Thyroxine Index: 1.4 (ref 1.2–4.9)
T3 Uptake Ratio: 25 % (ref 24–39)
T4, Total: 5.5 ug/dL (ref 4.5–12.0)
TSH: 1.18 u[IU]/mL (ref 0.450–4.500)

## 2023-03-22 LAB — HEPATITIS C ANTIBODY: Hep C Virus Ab: NONREACTIVE

## 2023-03-22 LAB — RPR: RPR Ser Ql: NONREACTIVE

## 2023-03-22 LAB — HIV ANTIBODY (ROUTINE TESTING W REFLEX): HIV Screen 4th Generation wRfx: NONREACTIVE

## 2023-03-23 ENCOUNTER — Other Ambulatory Visit: Payer: Self-pay

## 2023-03-23 ENCOUNTER — Encounter: Payer: Self-pay | Admitting: Certified Nurse Midwife

## 2023-03-23 DIAGNOSIS — N760 Acute vaginitis: Secondary | ICD-10-CM

## 2023-03-23 LAB — CERVICOVAGINAL ANCILLARY ONLY
Bacterial Vaginitis (gardnerella): POSITIVE — AB
Candida Glabrata: NEGATIVE
Candida Vaginitis: NEGATIVE
Chlamydia: NEGATIVE
Comment: NEGATIVE
Comment: NEGATIVE
Comment: NEGATIVE
Comment: NEGATIVE
Comment: NEGATIVE
Comment: NORMAL
Neisseria Gonorrhea: NEGATIVE
Trichomonas: NEGATIVE

## 2023-03-23 LAB — CYTOLOGY - PAP
Adequacy: ABSENT
Comment: NEGATIVE
Diagnosis: NEGATIVE
High risk HPV: NEGATIVE

## 2023-03-23 MED ORDER — METRONIDAZOLE 500 MG PO TABS
500.0000 mg | ORAL_TABLET | Freq: Two times a day (BID) | ORAL | 0 refills | Status: DC
Start: 2023-03-23 — End: 2024-08-14

## 2023-03-24 NOTE — Progress Notes (Signed)
GYNECOLOGY ANNUAL PREVENTATIVE CARE ENCOUNTER NOTE  History:     Mackenzie Moreno is a 47 y.o. [phone] female here for a routine annual gynecologic exam.  Current complaints: hot flashes and a reoccurring hemorrhoid. She reports new onset of hot flashes. She denies abnormal changes with her menstrual period. She reports regular 21-25 days cycles with a 7 day bleed. She states her heavy days are usually days 1-3 and then begins to get lighter. She denies pain with periods and painful intercourse. She states She has recently engaged with a new sexual partner and request STD testing. She Denies abnormal vaginal bleeding, discharge, pelvic pain, problems with intercourse or other gynecologic concerns.    Gynecologic History Patient's last menstrual period was 03/09/2023. Contraception: none Last Pap: 06/27/2019. Result was normal with negative HPV Last Mammogram: 09/14/2022.  Result was normal Last Colonoscopy: N/A. Not indicated at this time   Obstetric History OB History  Gravida Para Term Preterm AB Living  4 1 1  0 3 1  SAB IAB Ectopic Multiple Live Births  2 1 0 0 1    # Outcome Date GA Lbr Len/2nd Weight Sex Delivery Anes PTL Lv  4 Term 12/05/15 [redacted]w[redacted]d 05:22 / 01:15 8 lb 13.6 oz (4.014 kg) F Vag-Spont EPI  LIV  3 SAB           2 SAB           1 IAB             Past Medical History:  Diagnosis Date   Anemia    Hypertension    Missed ab    Trigeminal neuralgia 2021    Past Surgical History:  Procedure Laterality Date   CHOLECYSTECTOMY  2005   CHOLECYSTECTOMY     DILATION AND EVACUATION  05/18/2012   Procedure: DILATATION AND EVACUATION;  Surgeon: Annamaria Boots, MD;  Location: [facility];  Service: Gynecology;  Laterality: N/A;   TONSILLECTOMY  2007   WISDOM TOOTH EXTRACTION      Current Outpatient Medications on File Prior to Visit  Medication Sig Dispense Refill   amoxicillin (AMOXIL) 500 MG capsule Take 1 capsule (500 mg total) by mouth 2 (two) times daily for 10  days. 20 capsule 0   atorvastatin (LIPITOR) 20 MG tablet Take by mouth.     gabapentin (NEURONTIN) 600 MG tablet Take 600 mg by mouth 3 (three) times daily.     ibuprofen (ADVIL) 600 MG tablet TAKE 1 TABLET BY MOUTH EVERY 6 HOURS AS NEEDED FOR MILD PAIN 30 tablet 3   triamterene-hydrochlorothiazide (MAXZIDE-25) 37.5-25 MG tablet Take 1 tablet by mouth once daily 90 tablet 0   carbamazepine (CARBATROL) 200 MG 12 hr capsule Take by mouth. (Patient not taking: Reported on 03/21/2023)     cyclobenzaprine (FLEXERIL) 5 MG tablet Take 5 mg by mouth 3 (three) times daily as needed for muscle spasms. (Patient not taking: Reported on 03/21/2023)     diclofenac (VOLTAREN) 75 MG EC tablet Take 1 tablet (75 mg total) by mouth 2 (two) times daily. 50 tablet 2   norethindrone (MICRONOR) 0.35 MG tablet Take 1 tablet by mouth once daily (Patient not taking: Reported on 03/21/2023) 84 tablet 0   No current facility-administered medications on file prior to visit.    Allergies  Allergen Reactions   Vicodin [Hydrocodone-Acetaminophen] Rash    Social History:  reports that she has quit smoking. She has never used smokeless tobacco. She reports current alcohol use. She  reports that she does not use drugs.  Family History  Problem Relation Age of Onset   Cancer Mother    COPD Mother    Hypertension Mother    Heart disease Mother    Breast cancer Mother    Heart disease Father    Hypertension Brother     The following portions of the patient's history were reviewed and updated as appropriate: allergies, current medications, past family history, past medical history, past social history, past surgical history and problem list.  Review of Systems Pertinent items noted in HPI and remainder of comprehensive ROS otherwise negative.  Physical Exam:  BP (!) 151/104   Pulse 83   Ht 5\' 9"  (1.753 m)   Wt 234 lb 9.6 oz (106.4 kg)   LMP 03/09/2023   BMI 34.64 kg/m  CONSTITUTIONAL: Well-developed, well-nourished  female in no acute distress.  HENT:  Normocephalic, atraumatic, External right and left ear normal.  EYES: Conjunctivae and EOM are normal. Pupils are equal, round, and reactive to light. No scleral icterus.  NECK: Normal range of motion, supple, no masses.  Normal thyroid.  SKIN: Skin is warm and dry. No rash noted. Not diaphoretic. No erythema. No pallor. MUSCULOSKELETAL: Normal range of motion. No tenderness.  No cyanosis, clubbing, or edema. NEUROLOGIC: Alert and oriented to person, place, and time. Normal reflexes, muscle tone coordination.  PSYCHIATRIC: Normal mood and affect. Normal behavior. Normal judgment and thought content. CARDIOVASCULAR: Normal heart rate noted, regular rhythm RESPIRATORY: Clear to auscultation bilaterally. Effort and breath sounds normal, no problems with respiration noted. BREASTS: Symmetric in size. No masses, tenderness, skin changes, nipple drainage, or lymphadenopathy bilaterally. Performed in the presence of a chaperone. ABDOMEN: Soft, no distention noted.  No tenderness, rebound or guarding.  PELVIC: Normal appearing external genitalia and urethral meatus; normal appearing vaginal mucosa and cervix.  No abnormal vaginal discharge noted.  Pap smear obtained.  Normal uterine size, no other palpable masses, no uterine or adnexal tenderness.  Performed in the presence of a chaperone.   Assessment and Plan:    1. Encounter to establish care -  Patient doing well.    2. Hypertension, unspecified type - Hypertension currently on Amlodipine and HCTZ. HTN currently managed by PCP.    3. Hemorrhoids, unspecified hemorrhoid type - Patient reports a reoccurring hemorrhoid (1 occurrence every 3-6 months) that is easily managing with OTC cream.  - Recommended to continue the use of OTC management.   4. Hot flashes - Reviewed signs and symptoms of menopause and discussed that this could be a likely cause of her hot flashes.  - Cytology - PAP( [place]) -  Cervicovaginal ancillary only( [place]) - HIV antibody (with reflex) - RPR - Hepatitis C Antibody - Hepatitis B Surface AntiGEN - Thyroid Panel With TSH  5. Thyroid nodule - Patient has a known thyroid nodule that is being monitored by her PCP. She is also concerned that she is in a hormonal imbalance that is causing hot flashes.  - Thyroid panel collected today.   Will follow up results of pap smear and manage accordingly. Mammogram up to date Colon cancer screening is not indicated at this time.  Routine preventative health maintenance measures emphasized. Please refer to After Visit Summary for other counseling recommendations.      Jolin Benavides Danella Deis) Suzie Portela, MSN, CNM  [facility]  03/24/23 2:56 PM

## 2023-11-06 ENCOUNTER — Other Ambulatory Visit: Payer: Self-pay | Admitting: Adult Health Nurse Practitioner

## 2023-11-06 DIAGNOSIS — Z1231 Encounter for screening mammogram for malignant neoplasm of breast: Secondary | ICD-10-CM

## 2023-12-05 ENCOUNTER — Ambulatory Visit
Admission: RE | Admit: 2023-12-05 | Discharge: 2023-12-05 | Disposition: A | Discharge status: Home / Self Care | Payer: 59 | Source: Ambulatory Visit | Attending: Adult Health Nurse Practitioner

## 2023-12-05 DIAGNOSIS — Z1231 Encounter for screening mammogram for malignant neoplasm of breast: Secondary | ICD-10-CM

## 2024-08-14 ENCOUNTER — Encounter: Payer: Self-pay | Admitting: Podiatry

## 2024-08-14 ENCOUNTER — Ambulatory Visit: Admitting: Podiatry

## 2024-08-14 DIAGNOSIS — M7751 Other enthesopathy of right foot: Secondary | ICD-10-CM

## 2024-08-14 DIAGNOSIS — L84 Corns and callosities: Secondary | ICD-10-CM | POA: Diagnosis not present

## 2024-08-14 DIAGNOSIS — M7752 Other enthesopathy of left foot: Secondary | ICD-10-CM | POA: Diagnosis not present

## 2024-08-14 MED ORDER — TRIAMCINOLONE ACETONIDE 10 MG/ML IJ SUSP
10.0000 mg | Freq: Once | INTRAMUSCULAR | Status: AC
  Administered 2024-08-14: 10 mg via INTRA_ARTICULAR

## 2024-08-15 NOTE — Progress Notes (Signed)
 Subjective:   Patient ID: Odella ONEIDA Boop, female   DOB: 48 y.o.   MRN: [id number]   HPI Patient presents with severe lesions fifth digit right fifth metatarsal head left and hallux of both feet.  States the 1 on the fifth toe right and underneath the left of it was very very sore hard to walk with and this has been going on for a long time   ROS      Objective:  Physical Exam  Neurovascular status intact with inflammation fluid of the inner phalangeal joint digit 5 right with lesion and inflammatory fluid fifth MPJ left with lesion and keratotic lesion formation first metatarsal bilateral painful when pressed     Assessment:  Inflammatory capsulitis of the fifth digit right plantar fifth metatarsal left and severe lesion formation x 4     Plan:  H&P reviewed all conditions.  At this point I did a careful injection of the right fifth digit inner phalangeal joint and left fifth metatarsal head plantar 3 mg dexamethasone  Kenalog  5 mg Xylocaine  I debrided all lesions on both feet and I did discuss surgical intervention especially for the fifth digit right fifth metatarsal left that this may be what we need to do depending on response conservatively.  Patient will be seen back to recheck

## 2024-11-27 ENCOUNTER — Other Ambulatory Visit: Payer: Self-pay | Admitting: Adult Health Nurse Practitioner

## 2024-11-27 DIAGNOSIS — Z1231 Encounter for screening mammogram for malignant neoplasm of breast: Secondary | ICD-10-CM

## 2024-12-11 ENCOUNTER — Ambulatory Visit

## 2024-12-12 ENCOUNTER — Ambulatory Visit
Admission: RE | Admit: 2024-12-12 | Discharge: 2024-12-12 | Disposition: A | Discharge status: Home / Self Care | Source: Ambulatory Visit | Attending: Adult Health Nurse Practitioner | Admitting: Adult Health Nurse Practitioner

## 2024-12-12 DIAGNOSIS — Z1231 Encounter for screening mammogram for malignant neoplasm of breast: Secondary | ICD-10-CM

## 2024-12-19 ENCOUNTER — Encounter: Admitting: Dietician

## 2024-12-19 ENCOUNTER — Encounter: Payer: Self-pay | Admitting: Dietician

## 2024-12-19 NOTE — Progress Notes (Signed)
 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   Pt states she was not eligible for bariatric surgery in May and started taking phentermine for a short term option. Pt reports she developed healthy habits after her mom passed last May as a way to cope, but fell off the wagon after traveling in July. Pt states her mom used to be the primary chef and now she is getting back into cooking.   Pt talked to doctor a year ago about menopause because of symptoms of night sweats, digestive issues, waking up with heartburn.   Pt reports she did weight watchers and had seasons of working out consistently. Pt reports she has foot problems which has impacted working out.   Pt reports she shops at [employer] for one big grocery trip per month and will supplemental shop throughout the week.   Pt reports that on cheer nights for her daughter, they go out to eat at [facility]. Pt reports she will go to Chinese buffets and make a bowl for lunch.   Pt reports her daughter is a picky eater and that is a stressor.   Pt reports she does not have a gallbladder.   Estimated daily fluid intake: unable to assess Supplements: vitamin D3, vitamin b12 Sleep: disrupted Stress / self-care: got a massage membership Current average weekly physical activity: walking inconsistent   24-Hr Dietary Recall First Meal:  mcd[employer]ds steak, egg, biscuit OR jimmy dean turkey sausage wraps Snack:  Second Meal: bowl of soup, half a slice of pizza, teriyaki stir fry with chicken OR pack lunch with greek yogurt with granola Snack: pretzels or granola Third Meal: hotodgs with salad  Snack: pretzel, pimento cheese and crackers Beverages: water, coffee with creamer and splenda and mushroom blend   NUTRITION INTERVENTION  Nutrition education (E-1) on the following topics:  Fruits & Vegetables: Aim to fill half your plate with a variety of fruits and vegetables. They are rich in vitamins, minerals, and fiber, and can help reduce the risk of chronic diseases. Choose a colorful assortment of fruits and vegetables to ensure you get a wide range of nutrients. Grains and Starches: Make at least half of your grain choices whole grains, such as brown rice, whole wheat bread, and oats. Whole grains provide fiber, which aids in digestion and healthy cholesterol levels. Aim for whole forms of starchy vegetables such as potatoes, sweet potatoes, beans, peas, and corn, which are fiber rich and provide many vitamins and minerals.  Protein: Incorporate lean sources of protein, such as poultry, fish, beans, nuts, and seeds, into your meals. Protein is essential for building and repairing tissues, staying full, balancing blood sugar, as well as supporting immune function. Dairy: Include low-fat or fat-free dairy products like milk, yogurt, and cheese in your diet. Dairy foods are excellent sources of calcium  and vitamin D , which are crucial for bone health.  Physical Activity: Aim for 60 minutes of physical activity daily. Regular  physical activity promotes overall health-including helping to reduce risk for heart disease and diabetes, promoting mental health, and helping us  sleep better.    Handouts Provided Include  MyPlate MyPlate meal planner [employer] Division of Responsibility  Learning Style & Readiness for Change Teaching  method utilized: Visual & Auditory  Demonstrated degree of understanding via: Teach Back  Barriers to learning/adherence to lifestyle change: none reported  Goals Established by Pt Make 3-4 dinners per week at home.  Eat more mindfully at lunch such as stepping away from the desk, sitting somewhere else, make it social etc.  Workout 2-3x per week for 10-20 minutes   MONITORING & EVALUATION Dietary intake, weekly physical activity  Next Steps  Patient is to call for questions and return in 6 weeks.

## 2024-12-19 NOTE — Patient Instructions (Signed)
 Make 3-4 dinners per week at home.  Eat more mindfully at lunch such as stepping away from the desk, sitting somewhere else, make it social etc.  Workout 2-3x per week for 10-20 minutes

## 2025-01-30 ENCOUNTER — Encounter: Admitting: Dietician
